# Patient Record
Sex: Male | Born: 1965 | Race: White | Hispanic: No | Marital: Single | Smoking: Former smoker
Health system: Southern US, Community
[De-identification: ages and names within clinical notes are randomized; demographics above are authoritative.]

## PROBLEM LIST (undated history)

## (undated) DIAGNOSIS — F419 Anxiety disorder, unspecified: Secondary | ICD-10-CM

## (undated) DIAGNOSIS — B1920 Unspecified viral hepatitis C without hepatic coma: Secondary | ICD-10-CM

## (undated) DIAGNOSIS — F1920 Other psychoactive substance dependence, uncomplicated: Secondary | ICD-10-CM

## (undated) DIAGNOSIS — F329 Major depressive disorder, single episode, unspecified: Secondary | ICD-10-CM

## (undated) DIAGNOSIS — F32A Depression, unspecified: Secondary | ICD-10-CM

## (undated) HISTORY — PX: KNEE SURGERY: SHX244

## (undated) HISTORY — DX: Depression, unspecified: F32.A

## (undated) HISTORY — DX: Unspecified viral hepatitis C without hepatic coma: B19.20

## (undated) HISTORY — DX: Anxiety disorder, unspecified: F41.9

## (undated) HISTORY — DX: Major depressive disorder, single episode, unspecified: F32.9

## (undated) HISTORY — DX: Other psychoactive substance dependence, uncomplicated: F19.20

---

## 2005-01-30 ENCOUNTER — Emergency Department (HOSPITAL_COMMUNITY): Admission: EM | Admit: 2005-01-30 | Discharge: 2005-01-30 | Payer: Self-pay | Admitting: Emergency Medicine

## 2009-05-18 ENCOUNTER — Ambulatory Visit: Payer: Self-pay | Admitting: Family Medicine

## 2009-05-18 DIAGNOSIS — L723 Sebaceous cyst: Secondary | ICD-10-CM | POA: Insufficient documentation

## 2009-05-19 ENCOUNTER — Encounter: Payer: Self-pay | Admitting: Family Medicine

## 2009-05-29 ENCOUNTER — Encounter: Payer: Self-pay | Admitting: Family Medicine

## 2009-06-15 ENCOUNTER — Telehealth (INDEPENDENT_AMBULATORY_CARE_PROVIDER_SITE_OTHER): Payer: Self-pay | Admitting: *Deleted

## 2009-09-12 ENCOUNTER — Telehealth: Payer: Self-pay | Admitting: Family Medicine

## 2009-10-13 ENCOUNTER — Telehealth: Payer: Self-pay | Admitting: Family Medicine

## 2010-07-11 ENCOUNTER — Emergency Department (HOSPITAL_BASED_OUTPATIENT_CLINIC_OR_DEPARTMENT_OTHER): Admission: EM | Admit: 2010-07-11 | Discharge: 2010-07-12 | Payer: Self-pay | Admitting: Emergency Medicine

## 2010-07-12 ENCOUNTER — Inpatient Hospital Stay (HOSPITAL_COMMUNITY): Admission: EM | Admit: 2010-07-12 | Discharge: 2010-07-13 | Payer: Self-pay | Admitting: Psychiatry

## 2010-07-12 ENCOUNTER — Ambulatory Visit: Payer: Self-pay | Admitting: Psychiatry

## 2010-11-06 NOTE — Progress Notes (Signed)
  Phone Note Call from Patient   Caller: Spouse Call For: Loreen Freud DO Reason for Call: Refill Medication Summary of Call: needs cialis refills Initial call taken by: Loreen Freud DO,  October 13, 2009 12:35 PM  Follow-up for Phone Call        wife informed he has 5 refills Follow-up by: Loreen Freud DO,  October 13, 2009 12:37 PM

## 2010-12-20 LAB — BASIC METABOLIC PANEL
BUN: 14 mg/dL (ref 6–23)
CO2: 26 mEq/L (ref 19–32)
Calcium: 8.6 mg/dL (ref 8.4–10.5)
Creatinine, Ser: 0.8 mg/dL (ref 0.4–1.5)
GFR calc non Af Amer: >60 mL/min (ref >60)
Glucose, Bld: 103 mg/dL — ABNORMAL HIGH (ref 70–99)

## 2010-12-20 LAB — POCT TOXICOLOGY PANEL: Benzodiazepines: POSITIVE

## 2010-12-20 LAB — CBC
MCH: 32.1 pg (ref 26.0–34.0)
MCV: 97.2 fL (ref 78.0–100.0)
Platelets: 156 10*3/uL (ref 150–400)
RBC: 4.74 MIL/uL (ref 4.22–5.81)
WBC: 8.8 10*3/uL (ref 4.0–10.5)

## 2010-12-20 LAB — URINALYSIS, ROUTINE W REFLEX MICROSCOPIC
Hgb urine dipstick: NEGATIVE
Ketones, ur: NEGATIVE mg/dL
Nitrite: NEGATIVE
Specific Gravity, Urine: 1.028 (ref 1.005–1.030)
Urobilinogen, UA: 0.2 mg/dL (ref 0.0–1.0)
pH: 5.5 (ref 5.0–8.0)

## 2010-12-20 LAB — DIFFERENTIAL
Eosinophils Absolute: 0 10*3/uL (ref 0.0–0.7)
Monocytes Relative: 4 % (ref 3–12)
Neutro Abs: 6.3 10*3/uL (ref 1.7–7.7)
Neutrophils Relative %: 72 % (ref 43–77)

## 2010-12-20 LAB — VALPROIC ACID LEVEL: Valproic Acid Lvl: <10 ug/mL — ABNORMAL LOW (ref 50.0–100.0)

## 2010-12-20 LAB — ACETAMINOPHEN LEVEL: Acetaminophen (Tylenol), Serum: <10 ug/mL — ABNORMAL LOW (ref 10–30)

## 2011-05-31 ENCOUNTER — Other Ambulatory Visit: Payer: Self-pay | Admitting: Family Medicine

## 2011-05-31 NOTE — Telephone Encounter (Signed)
He is not a pt here anymore

## 2011-05-31 NOTE — Telephone Encounter (Signed)
Last seen 05/18/2009 and filled 09/12/2009 please advise     KP

## 2016-05-02 ENCOUNTER — Ambulatory Visit (INDEPENDENT_AMBULATORY_CARE_PROVIDER_SITE_OTHER): Payer: BLUE CROSS/BLUE SHIELD | Admitting: Psychology

## 2016-05-02 DIAGNOSIS — F1910 Other psychoactive substance abuse, uncomplicated: Secondary | ICD-10-CM | POA: Diagnosis not present

## 2016-05-07 ENCOUNTER — Ambulatory Visit (INDEPENDENT_AMBULATORY_CARE_PROVIDER_SITE_OTHER): Payer: BLUE CROSS/BLUE SHIELD | Admitting: Psychology

## 2016-05-07 DIAGNOSIS — F1910 Other psychoactive substance abuse, uncomplicated: Secondary | ICD-10-CM

## 2016-05-15 ENCOUNTER — Ambulatory Visit (INDEPENDENT_AMBULATORY_CARE_PROVIDER_SITE_OTHER): Payer: BLUE CROSS/BLUE SHIELD | Admitting: Psychology

## 2016-05-15 DIAGNOSIS — F1910 Other psychoactive substance abuse, uncomplicated: Secondary | ICD-10-CM

## 2016-05-27 ENCOUNTER — Ambulatory Visit (INDEPENDENT_AMBULATORY_CARE_PROVIDER_SITE_OTHER): Payer: BLUE CROSS/BLUE SHIELD | Admitting: Psychology

## 2016-05-27 DIAGNOSIS — F1910 Other psychoactive substance abuse, uncomplicated: Secondary | ICD-10-CM | POA: Diagnosis not present

## 2016-06-05 ENCOUNTER — Ambulatory Visit (INDEPENDENT_AMBULATORY_CARE_PROVIDER_SITE_OTHER): Payer: BLUE CROSS/BLUE SHIELD | Admitting: Psychology

## 2016-06-05 DIAGNOSIS — F1910 Other psychoactive substance abuse, uncomplicated: Secondary | ICD-10-CM | POA: Diagnosis not present

## 2016-06-24 ENCOUNTER — Ambulatory Visit (INDEPENDENT_AMBULATORY_CARE_PROVIDER_SITE_OTHER): Payer: BLUE CROSS/BLUE SHIELD | Admitting: Psychology

## 2016-06-24 DIAGNOSIS — F1910 Other psychoactive substance abuse, uncomplicated: Secondary | ICD-10-CM

## 2016-07-10 ENCOUNTER — Ambulatory Visit (INDEPENDENT_AMBULATORY_CARE_PROVIDER_SITE_OTHER): Payer: BLUE CROSS/BLUE SHIELD | Admitting: Psychology

## 2016-07-10 DIAGNOSIS — F1910 Other psychoactive substance abuse, uncomplicated: Secondary | ICD-10-CM | POA: Diagnosis not present

## 2016-07-22 ENCOUNTER — Ambulatory Visit: Payer: BLUE CROSS/BLUE SHIELD | Admitting: Psychology

## 2016-08-06 ENCOUNTER — Ambulatory Visit (INDEPENDENT_AMBULATORY_CARE_PROVIDER_SITE_OTHER): Payer: BLUE CROSS/BLUE SHIELD | Admitting: Psychology

## 2016-08-06 DIAGNOSIS — F1910 Other psychoactive substance abuse, uncomplicated: Secondary | ICD-10-CM

## 2016-08-23 ENCOUNTER — Ambulatory Visit: Payer: BLUE CROSS/BLUE SHIELD | Admitting: Psychology

## 2016-09-09 ENCOUNTER — Ambulatory Visit (INDEPENDENT_AMBULATORY_CARE_PROVIDER_SITE_OTHER): Payer: BLUE CROSS/BLUE SHIELD | Admitting: Psychology

## 2016-09-09 DIAGNOSIS — F1910 Other psychoactive substance abuse, uncomplicated: Secondary | ICD-10-CM
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
## 2016-09-18 ENCOUNTER — Ambulatory Visit (INDEPENDENT_AMBULATORY_CARE_PROVIDER_SITE_OTHER): Payer: BLUE CROSS/BLUE SHIELD | Admitting: Psychiatry

## 2016-09-18 ENCOUNTER — Encounter (HOSPITAL_COMMUNITY): Payer: Self-pay | Admitting: Psychiatry

## 2016-09-18 VITALS — BP 142/78 | HR 68 | Ht 74.0 in | Wt 212.0 lb

## 2016-09-18 DIAGNOSIS — Z79899 Other long term (current) drug therapy: Secondary | ICD-10-CM | POA: Diagnosis not present

## 2016-09-18 DIAGNOSIS — F411 Generalized anxiety disorder: Secondary | ICD-10-CM

## 2016-09-18 DIAGNOSIS — Z9889 Other specified postprocedural states: Secondary | ICD-10-CM

## 2016-09-18 DIAGNOSIS — Z87891 Personal history of nicotine dependence: Secondary | ICD-10-CM

## 2016-09-18 MED ORDER — GABAPENTIN 300 MG PO CAPS: ORAL_CAPSULE | ORAL | 1 refills | Status: DC

## 2016-09-18 NOTE — Patient Instructions (Addendum)
1. Continue Wellbutrin 150 mg daily 2. Start gabapentin 300 mg at night for one week, then 300 mg twice a day 3. Discontinue lamotrigine 4. Return to clinic in January

## 2016-10-03 ENCOUNTER — Ambulatory Visit (INDEPENDENT_AMBULATORY_CARE_PROVIDER_SITE_OTHER): Payer: BLUE CROSS/BLUE SHIELD | Admitting: Psychology

## 2016-10-03 DIAGNOSIS — F1910 Other psychoactive substance abuse, uncomplicated: Secondary | ICD-10-CM | POA: Diagnosis not present

## 2016-10-09 ENCOUNTER — Telehealth (HOSPITAL_COMMUNITY): Payer: Self-pay

## 2016-10-09 NOTE — Telephone Encounter (Signed)
Patient called to let you know that he had some really bad side effects on the Gabapentin, he stopped taking it and feels better now. Patient states that his mood is stable and his anxiety seems better. He just wanted you to know

## 2016-10-21 ENCOUNTER — Ambulatory Visit (INDEPENDENT_AMBULATORY_CARE_PROVIDER_SITE_OTHER): Payer: BLUE CROSS/BLUE SHIELD | Admitting: Psychology

## 2016-10-21 DIAGNOSIS — F1910 Other psychoactive substance abuse, uncomplicated: Secondary | ICD-10-CM
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
## 2016-11-08 ENCOUNTER — Ambulatory Visit: Payer: Self-pay | Admitting: Psychology

## 2016-11-11 ENCOUNTER — Ambulatory Visit (HOSPITAL_COMMUNITY): Payer: Self-pay | Admitting: Psychiatry

## 2016-12-09 ENCOUNTER — Other Ambulatory Visit (HOSPITAL_COMMUNITY): Payer: Self-pay

## 2016-12-09 MED ORDER — BUPROPION HCL ER (XL) 150 MG PO TB24
150.0000 mg | ORAL_TABLET | Freq: Every day | ORAL | 0 refills | Status: DC
Start: 2016-12-09 — End: 2016-12-20

## 2016-12-16 ENCOUNTER — Ambulatory Visit (HOSPITAL_COMMUNITY): Payer: Self-pay | Admitting: Psychiatry

## 2016-12-20 ENCOUNTER — Encounter (HOSPITAL_COMMUNITY): Payer: Self-pay | Admitting: Psychiatry

## 2016-12-20 ENCOUNTER — Ambulatory Visit (INDEPENDENT_AMBULATORY_CARE_PROVIDER_SITE_OTHER): Payer: BLUE CROSS/BLUE SHIELD | Admitting: Psychiatry

## 2016-12-20 ENCOUNTER — Encounter: Payer: Self-pay | Admitting: Psychiatry

## 2016-12-20 VITALS — BP 122/74 | HR 60 | Ht 74.0 in | Wt 215.0 lb

## 2016-12-20 DIAGNOSIS — Z87891 Personal history of nicotine dependence: Secondary | ICD-10-CM | POA: Diagnosis not present

## 2016-12-20 DIAGNOSIS — F3341 Major depressive disorder, recurrent, in partial remission: Secondary | ICD-10-CM | POA: Diagnosis not present

## 2016-12-20 DIAGNOSIS — Z79899 Other long term (current) drug therapy: Secondary | ICD-10-CM | POA: Diagnosis not present

## 2016-12-20 MED ORDER — TRAZODONE HCL 50 MG PO TABS: 50.0000 mg | ORAL_TABLET | Freq: Every day | ORAL | 1 refills | Status: DC

## 2016-12-20 MED ORDER — BUPROPION HCL ER (XL) 300 MG PO TB24: 300.0000 mg | ORAL_TABLET | Freq: Every day | ORAL | 0 refills | Status: DC

## 2016-12-20 NOTE — Progress Notes (Signed)
BH MD/PA/NP OP Progress Note  12/20/2016 8:42 AM Timothy Ramsey  MRN:  [id number]  Chief Complaint: anxiety Subjective:  Patient presents today for psychiatric transfer of care. He was previously seen in this clinic by another provider. I reviewed those notes prior to his presentation.  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 He reports, with shame the damage that his substance abuse contributed to his family. He is proud to share that he has now been abstinent since May 2016, and continues to engage in [employer] meetings 2-3 times weekly. 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 He [place], and has been working on reestablishing a relationship with his children, ages 3213, 7515, 4318. He reports that he still struggles with some down and depressed mood at times, but he feels that the Wellbutrin has helped to some degree. He denies any suicidal thoughts. He reports that he has a good social support network, and is close with his parents.  He reports he's been thinking about getting a dog.  Regarding his mood, he would be on board with increasing Wellbutrin to 300 mg. We reviewed the risks and benefits. He continues to have some sleep difficulties, namely frequent awakenings in the night. We discussed the use of trazodone, risks and benefits of this. He agrees to trial of this. He has not been taking gabapentin, as it made him feel sedated and weird. We agreed to make these changes and follow-up in 2 months, or sooner if needed.  Visit Diagnosis:    ICD-9-CM ICD-10-CM   1. Recurrent major depressive disorder, in partial remission (HCC) 296.35 F33.41 buPROPion (WELLBUTRIN XL) 300 MG 24 hr tablet     traZODone (DESYREL) 50 MG tablet     Past Psychiatric History: See intake H&P for full details. Reviewed, with no updates at this time.  Past Medical History:  Past Medical History:  Diagnosis Date  . Addiction to drug (HCC)   . Anxiety   . Depression   . Hepatitis C     Past Surgical History:  Procedure Laterality Date  . KNEE SURGERY      Family Psychiatric History: See intake H&P for full details. Reviewed, with no updates at this time.   Family History: No family history on file.  Social History:  Social History   Social History  . Marital status: Married    Spouse name: N/A  . Number of children: N/A  . Years of education: N/A   Social History Main Topics  . Smoking status: Former Smoker    Types: Cigarettes    Quit date: 02/07/2015  . Smokeless tobacco: Current User    Types: Snuff  . Alcohol use No  . Drug use: No  . Sexual activity: Yes    Birth control/ protection: None   Other Topics Concern  . Not on file   Social History Narrative  . No narrative on file    Allergies: Not on File  Metabolic Disorder Labs: No results found for: HGBA1C, MPG No results found for: PROLACTIN No results found for: CHOL, TRIG, HDL, CHOLHDL, VLDL, LDLCALC   Current Medications: Current Outpatient Prescriptions  Medication Sig Dispense Refill  . buPROPion (WELLBUTRIN XL) 300 MG 24 hr tablet Take 1  tablet (300 mg total) by mouth daily. 90 tablet 0  . hydrochlorothiazide (HYDRODIURIL) 25 MG tablet Take by mouth.    . Multiple Vitamin (THERA) TABS Take by mouth.    . traZODone (DESYREL) 50 MG tablet Take 1 tablet (50 mg total) by mouth at bedtime. Take 50 to 100 mg at bedtime 90 tablet 1   No current facility-administered medications for this visit.     Neurologic: Headache: Negative Seizure: Negative Paresthesias: Negative  Musculoskeletal: Strength & Muscle Tone: within normal limits Gait & Station: normal Patient leans: N/A  Psychiatric Specialty Exam: Review of Systems  Constitutional:  Negative.   HENT: Negative.   Respiratory: Negative.   Cardiovascular: Negative.   Gastrointestinal: Negative.   Genitourinary: Negative.   Musculoskeletal: Negative.   Neurological: Negative.   Psychiatric/Behavioral: Positive for depression. The patient has insomnia.     Blood pressure 122/74, pulse 60, height 6\' 2"  (1.88 m), weight 97.5 kg (215 lb).Body mass index is 27.6 kg/m.  General Appearance: Casual  Eye Contact:  Good  Speech:  Clear and Coherent  Volume:  Normal  Mood:  Euthymic  Affect:  Appropriate  Thought Process:  Goal Directed  Orientation:  Full (Time, Place, and Person)  Thought Content: Logical   Suicidal Thoughts:  No  Homicidal Thoughts:  No  Memory:  Immediate;   Good  Judgement:  Good  Insight:  Good  Psychomotor Activity:  Normal  Concentration:  Concentration: Good and Attention Span: Good  Recall:  Good  Fund of Knowledge: Good  Language: Good  Akathisia:  Negative  Handed:  Right  AIMS (if indicated):  n/a  Assets:  Communication Skills Desire for Improvement Financial Resources/Insurance Housing Intimacy Leisure Time Physical Health Resilience Social Support Talents/Skills Transportation Vocational/Educational  ADL's:  Intact  Cognition: WNL  Sleep:  4-5 hours nightly    Treatment Plan Summary:Timothy Ramsey is a 51 year old male with a history of anxiety depression and hepatitis C (s/p treatment and remission) due to substance use disorder, abstinent since May 2016, who presents today for medication management follow-up.  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 He works out regularly, and has a strong sense of faith. He is actively engaged in [employer] His mood appears to be generally improving with Wellbutrin, but would benefit from and up titration. He has continued struggle with insomnia, and would benefit from a mild to  moderate dose of trazodone.  No acute safety issues and will follow up in 2 months.  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 This includes every day phone calls, and random drug screens.  1. Recurrent major depressive disorder, in partial remission (HCC)    - Wellbutrin increased to 300 mg XL daily - Gabapentin discontinued by patient given in tolerance and sedation - Trazodone 50-100 mg nightly for sleep - Return to clinic in 2 months - Patient to continue engaging in AA meetings   Burnard Leigh, MD 12/20/2016, 8:33 AM

## 2017-02-19 ENCOUNTER — Ambulatory Visit (INDEPENDENT_AMBULATORY_CARE_PROVIDER_SITE_OTHER): Payer: BLUE CROSS/BLUE SHIELD | Admitting: Psychiatry

## 2017-02-19 ENCOUNTER — Encounter (HOSPITAL_COMMUNITY): Payer: Self-pay | Admitting: Psychiatry

## 2017-02-19 DIAGNOSIS — Z79899 Other long term (current) drug therapy: Secondary | ICD-10-CM | POA: Diagnosis not present

## 2017-02-19 DIAGNOSIS — F3341 Major depressive disorder, recurrent, in partial remission: Secondary | ICD-10-CM | POA: Diagnosis not present

## 2017-02-19 DIAGNOSIS — Z87891 Personal history of nicotine dependence: Secondary | ICD-10-CM

## 2017-02-19 MED ORDER — TRAZODONE HCL 50 MG PO TABS: 50.0000 mg | ORAL_TABLET | Freq: Every day | ORAL | 1 refills | Status: DC

## 2017-02-19 MED ORDER — BUPROPION HCL ER (XL) 300 MG PO TB24: 300.0000 mg | ORAL_TABLET | Freq: Every day | ORAL | 1 refills | Status: DC

## 2017-02-19 NOTE — Progress Notes (Signed)
BH MD/PA/NP OP Progress Note  02/19/2017 2:25 PM Timothy Ramsey  MRN:  [id number]  Chief Complaint: doing well  Subjective:  Timothy Ramsey presents today for psychiatric follow-up for alcohol use disorder and depression graduated him on celebrating 2 years sobriety last week. 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 He reports that he continues to have some relationship difficulties with his girlfriend, and I suggested they consider couples therapy. He was very receptive to this, and will talk with his girlfriend about starting that closer to [place] and [place].  He feels like the Wellbutrin 300 mg has been well-tolerated, and his mood is in good spirits. He is sleeping well, without the use of trazodone. He reports that he has used trazodone once or twice in the past month, and it's available if he has difficulty sleeping. He denies any significant mood symptoms. Reports that he has been handling regular stressors related to work and relationships very well. We spent time discussing his ongoing sobriety, participation in AA, and long-term management with Wellbutrin. He agrees to follow-up in 4-6 months, or sooner if needed.  Visit Diagnosis:    ICD-9-CM ICD-10-CM   1. Recurrent major depressive disorder, in partial remission (HCC) 296.35 F33.41 traZODone (DESYREL) 50 MG tablet     buPROPion (WELLBUTRIN XL) 300 MG 24 hr tablet    Past Psychiatric History: See intake H&P for full details. Reviewed, with no updates at this time.   Past Medical History:  Past Medical History:  Diagnosis Date  . Addiction to drug (HCC)   . Anxiety   . Depression   . Hepatitis C     Past Surgical History:  Procedure Laterality Date  . KNEE SURGERY      Family Psychiatric History: See intake H&P for full details. Reviewed, with no updates at this time.   Family History: History reviewed. No pertinent family history.  Social  History:  Social History   Social History  . Marital status: Married    Spouse name: N/A  . Number of children: N/A  . Years of education: N/A   Social History Main Topics  . Smoking status: Former Smoker    Types: Cigarettes    Quit date: 02/07/2015  . Smokeless tobacco: Current User    Types: Snuff  . Alcohol use No  . Drug use: No  . Sexual activity: Yes    Birth control/ protection: None   Other Topics Concern  . None   Social History Narrative  . None    Allergies: Not on File  Metabolic Disorder Labs: No results found for: HGBA1C, MPG No results found for: PROLACTIN No results found for: CHOL, TRIG, HDL, CHOLHDL, VLDL, LDLCALC   Current Medications: Current Outpatient Prescriptions  Medication Sig Dispense Refill  . buPROPion (WELLBUTRIN XL) 300 MG 24 hr tablet Take 1 tablet (300 mg total) by mouth daily. 90 tablet 1  . hydrochlorothiazide (HYDRODIURIL) 25 MG tablet Take by mouth.    . Multiple Vitamin (THERA) TABS Take by mouth.    . traZODone (DESYREL) 50 MG tablet Take 1 tablet (50 mg total) by mouth at bedtime. Take 50 to 100 mg at bedtime 90 tablet 1   No current facility-administered medications for this visit.     Neurologic: Headache: Negative Seizure: Negative Paresthesias: Negative  Musculoskeletal: Strength & Muscle Tone: within normal limits Gait & Station: normal Patient leans: N/A  Psychiatric Specialty Exam: ROS  Blood pressure 138/70, pulse 88, height 6\' 2"  (1.88 m), weight 213 lb (96.6 kg).Body mass index is 27.35 kg/m.  General Appearance: Casual and Well Groomed  Eye Contact:  Good  Speech:  Clear and Coherent  Volume:  Normal  Mood:  Euthymic and good cheer  Affect:  Congruent  Thought Process:  Goal Directed  Orientation:  Full (Time, Place, and Person)  Thought Content: Logical   Suicidal Thoughts:  No  Homicidal Thoughts:  No  Memory:  Immediate;   Good  Judgement:  Good  Insight:  Good  Psychomotor Activity:  Normal   Concentration:  Concentration: Good  Recall:  Good  Fund of Knowledge: Good  Language: Good  Akathisia:  Negative  Handed:  Right  AIMS (if indicated):  0  Assets:  Communication Skills Desire for Improvement Financial Resources/Insurance Housing Intimacy Leisure Time Physical Health Resilience Social Support Talents/Skills Transportation Vocational/Educational  ADL's:  Intact  Cognition: WNL  Sleep:  7-9 hours   Treatment Plan Summary: Timothy Ramsey is a 51 year old male with alcohol use disorder in sustained remission for 2 years, major depressive disorder, who presents today for psychiatric follow-up. He has had complete remission of his symptoms with Wellbutrin 300 mg daily. He continues to engage in [place] and substance use groups and [place]. He is employed, and maintains regular contact with his children who live nearby. No acute safety issues, and is appropriate to follow-up in 4-6 months or sooner if needed  1. Recurrent major depressive disorder, in partial remission (HCC)    Continue Wellbutrin 300 mg daily Trazodone 50-100 mg nightly for sleep as needed Follow-up in 4-6 months Should continue to follow with [place] in psychiatry, in case he has any major stressors, as that would be a risk for relapse  Burnard Leigh, MD 02/19/2017, 2:25 PM

## 2017-05-19 ENCOUNTER — Ambulatory Visit (INDEPENDENT_AMBULATORY_CARE_PROVIDER_SITE_OTHER): Payer: BLUE CROSS/BLUE SHIELD | Admitting: Psychology

## 2017-05-19 DIAGNOSIS — F1910 Other psychoactive substance abuse, uncomplicated: Secondary | ICD-10-CM | POA: Diagnosis not present

## 2017-06-16 ENCOUNTER — Ambulatory Visit (INDEPENDENT_AMBULATORY_CARE_PROVIDER_SITE_OTHER): Payer: BLUE CROSS/BLUE SHIELD | Admitting: Psychiatry

## 2017-06-16 ENCOUNTER — Encounter (HOSPITAL_COMMUNITY): Payer: Self-pay | Admitting: Psychiatry

## 2017-06-16 DIAGNOSIS — Z87891 Personal history of nicotine dependence: Secondary | ICD-10-CM | POA: Diagnosis not present

## 2017-06-16 DIAGNOSIS — F419 Anxiety disorder, unspecified: Secondary | ICD-10-CM | POA: Diagnosis not present

## 2017-06-16 DIAGNOSIS — F3341 Major depressive disorder, recurrent, in partial remission: Secondary | ICD-10-CM | POA: Diagnosis not present

## 2017-06-16 DIAGNOSIS — F1921 Other psychoactive substance dependence, in remission: Secondary | ICD-10-CM | POA: Diagnosis not present

## 2017-06-16 MED ORDER — ESCITALOPRAM OXALATE 10 MG PO TABS: 10.0000 mg | ORAL_TABLET | Freq: Every day | ORAL | 1 refills | Status: DC

## 2017-06-16 MED ORDER — BUPROPION HCL ER (XL) 300 MG PO TB24: 300.0000 mg | ORAL_TABLET | Freq: Every day | ORAL | 1 refills | Status: DC

## 2017-06-16 NOTE — Progress Notes (Signed)
BH MD/PA/NP OP Progress Note  06/16/2017 2:30 PM JERAME HEDDING  MRN:  [id number]  Chief Complaint:  Chief Complaint    Follow-up     HPI: TAFARI HUMISTON reports that things are been pretty well in terms of his mood, particularly his depression has been kept at bay. He's noticed a little bit more anxiety and had some episodes of panic-like feelings and sensations. These had a lot going on in terms of life stressors, anxious about his son starting college, and his other son looking at new colleges for next year. 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 He denies any substance abuse, and denies any intention or thoughts to use. He reports that he and his girlfriend continue to work on strengthening the relationship. We discussed augmenting Wellbutrin 300 mg with a dose of Lexapro 10 mg, as he had done fairly well in the past with the 2 in combination. He agrees to follow-up in 3 months or sooner if needed.  Visit Diagnosis:    ICD-10-CM   1. Recurrent major depressive disorder, in partial remission (HCC) F33.41 escitalopram (LEXAPRO) 10 MG tablet    buPROPion (WELLBUTRIN XL) 300 MG 24 hr tablet    Past Psychiatric History: See intake H&P for full details. Reviewed, with no updates at this time.   Past Medical History:  Past Medical History:  Diagnosis Date  . Addiction to drug (HCC)   . Anxiety   . Depression   . Hepatitis C     Past Surgical History:  Procedure Laterality Date  . KNEE SURGERY      Family Psychiatric History: See intake H&P for full details. Reviewed, with no updates at this time.   Family History: History reviewed. No pertinent family history.  Social History:  Social History   Social History  . Marital status: Married    Spouse name: N/A  . Number of children: N/A  . Years of education: N/A   Social History Main Topics  . Smoking status: Former Smoker    Types:  Cigarettes    Quit date: 02/07/2015  . Smokeless tobacco: Former Neurosurgeon    Types: Snuff    Quit date: 12/14/2016  . Alcohol use No  . Drug use: No  . Sexual activity: Yes    Birth control/ protection: None   Other Topics Concern  . None   Social History Narrative  . None    Allergies: Not on File  Metabolic Disorder Labs: No results found for: HGBA1C, MPG No results found for: PROLACTIN No results found for: CHOL, TRIG, HDL, CHOLHDL, VLDL, LDLCALC No results found for: TSH  Therapeutic Level Labs: No results found for: LITHIUM Lab Results  Component Value Date   VALPROATE <10.0 (L) 07/11/2010   No components found for:  CBMZ  Current Medications: Current Outpatient Prescriptions  Medication Sig Dispense Refill  . buPROPion (WELLBUTRIN XL) 300 MG 24 hr tablet Take 1 tablet (300 mg total) by mouth daily. 90 tablet 1  . hydrochlorothiazide (HYDRODIURIL) 25 MG tablet Take by mouth.    . Multiple Vitamin (THERA) TABS Take by mouth.    . escitalopram (LEXAPRO) 10 MG tablet Take 1 tablet (10 mg total) by mouth daily. 90 tablet 1   No current facility-administered medications for this visit.      Musculoskeletal: Strength & Muscle Tone: within normal limits Gait & Station: normal Patient leans: N/A  Psychiatric Specialty Exam: ROS  Blood pressure (!) 155/97, pulse 68, height 6\' 2"  (1.88 m), weight 204 lb 9.6 oz (92.8 kg).Body mass index is 26.27 kg/m.  General Appearance: Casual and Well Groomed  Eye Contact:  Good  Speech:  Clear and Coherent  Volume:  Normal  Mood:  Anxious and Euthymic  Affect:  Appropriate and Congruent  Thought Process:  Goal Directed  Orientation:  Full (Time, Place, and Person)  Thought Content: Logical   Suicidal Thoughts:  No  Homicidal Thoughts:  No  Memory:  Immediate;   Good  Judgement:  Good  Insight:  Good  Psychomotor Activity:  Normal  Concentration:  Concentration: Fair  Recall:  Fair  Fund of Knowledge: Good  Language: Good   Akathisia:  Negative  Handed:  Right  AIMS (if indicated): not done  Assets:  Communication Skills Desire for Improvement Financial Resources/Insurance Housing Intimacy Leisure Time Physical Health Resilience Social Support Talents/Skills Transportation Vocational/Educational  ADL's:  Intact  Cognition: WNL  Sleep:  Good   Screenings:   Assessment and Plan: Maren BeachDevin F Lobban is a 51 year old male with substance abuse in sustained remission, and major depressive disorder. He has had an increase in anxiety lately with some life and external stressors, but overall remains abstinent and safe with himself. He would benefit from a low-dose of SSRI in addition to continued Wellbutrin. He does not often use trazodone, but has it available if needed. We will follow-up in 3 months or sooner if needed.  1. Recurrent major depressive disorder, in partial remission (HCC)    Continue Wellbutrin 300 mg XL daily Initiate Lexapro 10 mg daily Trazodone discontinued off of his list, he uses this sparingly, and prefers to avoid it Return to clinic in 3 months  Burnard LeighAlexander Arya Amiel Mccaffrey, MD 06/16/2017, 2:30 PM

## 2017-06-19 ENCOUNTER — Telehealth (HOSPITAL_COMMUNITY): Payer: Self-pay

## 2017-06-19 NOTE — Telephone Encounter (Signed)
Patient is calling because he was started on Lexapro a few days ago and since then his already high blood pressure is even higher. He saw his PCP today and she believes there could be a correlation, please review and advise, thank you

## 2017-06-20 NOTE — Telephone Encounter (Signed)
Called patient and discussed decreasing Lexapro for a week, patient is agreeable to this plan

## 2017-06-20 NOTE — Telephone Encounter (Signed)
That would be quite unusual, but he can go ahead and decrease the lexapro to 5 mg for the next week to let his body get used to the effects

## 2017-06-30 ENCOUNTER — Ambulatory Visit: Payer: Self-pay | Admitting: Psychology

## 2017-09-15 ENCOUNTER — Ambulatory Visit (HOSPITAL_COMMUNITY): Payer: Self-pay | Admitting: Psychiatry

## 2018-03-10 ENCOUNTER — Other Ambulatory Visit (HOSPITAL_COMMUNITY): Payer: Self-pay

## 2018-03-10 ENCOUNTER — Telehealth (HOSPITAL_COMMUNITY): Payer: Self-pay

## 2018-03-10 DIAGNOSIS — F3341 Major depressive disorder, recurrent, in partial remission: Secondary | ICD-10-CM

## 2018-03-10 MED ORDER — BUPROPION HCL ER (XL) 300 MG PO TB24: 300.0000 mg | ORAL_TABLET | Freq: Every day | ORAL | 0 refills | Status: DC

## 2018-03-10 NOTE — Telephone Encounter (Signed)
Sent in a 30 day refill to [facility] on [address]

## 2018-03-10 NOTE — Telephone Encounter (Signed)
Patient called requesting a refill on Bupropion 300mg . Patient had to reschedule appointment for 03-12-18 to 04-01-18. [facility] on [address]. Please advise

## 2018-03-10 NOTE — Telephone Encounter (Signed)
Okay to send 30 days, thank you!

## 2018-03-12 ENCOUNTER — Ambulatory Visit (HOSPITAL_COMMUNITY): Payer: Self-pay | Admitting: Psychiatry

## 2018-04-01 ENCOUNTER — Encounter (HOSPITAL_COMMUNITY): Payer: Self-pay | Admitting: Psychiatry

## 2018-04-01 ENCOUNTER — Encounter

## 2018-04-01 ENCOUNTER — Ambulatory Visit (HOSPITAL_COMMUNITY): Payer: BLUE CROSS/BLUE SHIELD | Admitting: Psychiatry

## 2018-04-01 VITALS — BP 148/100 | HR 74 | Ht 74.0 in | Wt 204.0 lb

## 2018-04-01 DIAGNOSIS — F332 Major depressive disorder, recurrent severe without psychotic features: Secondary | ICD-10-CM

## 2018-04-01 MED ORDER — BUPROPION HCL ER (XL) 150 MG PO TB24: ORAL_TABLET | ORAL | 0 refills | Status: AC

## 2018-04-01 MED ORDER — BUPROPION HCL ER (XL) 300 MG PO TB24: 300.0000 mg | ORAL_TABLET | Freq: Every day | ORAL | 0 refills | Status: AC

## 2018-04-01 NOTE — Progress Notes (Signed)
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  He reports that his parents are very supportive, and he was not able to afford his medicines for a few months, so they paid for him to continue on Wellbutrin.  We discussed an increase in Wellbutrin to 450 mg given that he had some understandable depressive symptoms given the stressors in his life.  He reports that when he was off of his Wellbutrin he said some very mean things to his daughter's and they have not spoken to him since then, he has made multiple attempts to apologize and will keep trying to do so.   He has not used any substances of abuse and reports that his sobriety remains very important to him.  He wonders about TMS to address depressive symptoms given that he has been tried on Wellbutrin, Lexapro, Prozac, Zoloft, trazodone, Lamictal, and has still had significant breakthrough depressive symptoms.  I educated him on the risks and benefits of TMS, including the risk of seizure in 1 out of 1000 patients.  I educated him on the modality of action.  Confirmed that he does not have any medical contraindications to TMS and he was receptive to receiving prior authorization processing and proceeding with treatment when approved.  Reviewed the risks of increasing Wellbutrin, and encouraged him to reach out if he has any worsening of mood or difficulty with sleep.  Visit Diagnosis:    ICD-10-CM   1. Severe episode of recurrent major depressive disorder, without psychotic features (HCC) F33.2 buPROPion (WELLBUTRIN XL) 300 MG 24 hr tablet    buPROPion (WELLBUTRIN XL) 150 MG 24 hr tablet    Past Psychiatric History: See  intake H&P for full details. Reviewed, with no updates at this time.   Past Medical History:  Past Medical History:  Diagnosis Date  . Addiction to drug (HCC)   . Anxiety   . Depression   . Hepatitis C     Past Surgical History:  Procedure Laterality Date  . KNEE SURGERY      Family Psychiatric History: See intake H&P for full details. Reviewed, with no updates at this time.   Family History: No family history on file.  Social History:  Social History   Socioeconomic History  . Marital status: Married    Spouse name: Not on file  . Number of children: Not on file  . Years of education: Not on file  . Highest education level: Not on file  Occupational History  . Not on file  Social Needs  . Financial resource strain: Not on file  . Food insecurity:    Worry: Not on file    Inability: Not on file  . Transportation needs:    Medical: Not on file    Non-medical: Not on file  Tobacco Use  . Smoking status: Former Smoker    Types: Cigarettes    Last attempt to quit: 02/07/2015    Years since quitting: 3.1  . Smokeless tobacco: Former NeurosurgeonUser    Types: Snuff    Quit date: 12/14/2016  Substance and Sexual Activity  . Alcohol use: No  . Drug use: No  .  Sexual activity: Yes    Birth control/protection: None  Lifestyle  . Physical activity:    Days per week: Not on file    Minutes per session: Not on file  . Stress: Not on file  Relationships  . Social connections:    Talks on phone: Not on file    Gets together: Not on file    Attends religious service: Not on file    Active member of club or organization: Not on file    Attends meetings of clubs or organizations: Not on file    Relationship status: Not on file  Other Topics Concern  . Not on file  Social History Narrative  . Not on file    Allergies: Not on File  Metabolic Disorder Labs: No results found for: HGBA1C, MPG No results found for: PROLACTIN No results found for: CHOL, TRIG, HDL, CHOLHDL, VLDL,  LDLCALC No results found for: TSH  Therapeutic Level Labs: No results found for: LITHIUM Lab Results  Component Value Date   VALPROATE <10.0 (L) 07/11/2010   No components found for:  CBMZ  Current Medications: Current Outpatient Medications  Medication Sig Dispense Refill  . buPROPion (WELLBUTRIN XL) 300 MG 24 hr tablet Take 1 tablet (300 mg total) by mouth daily. 90 tablet 0  . Multiple Vitamin (THERA) TABS Take by mouth.    Marland Kitchen buPROPion (WELLBUTRIN XL) 150 MG 24 hr tablet Take with 300 for a total of 450 90 tablet 0  . hydrochlorothiazide (HYDRODIURIL) 25 MG tablet Take by mouth.     No current facility-administered medications for this visit.      Musculoskeletal: Strength & Muscle Tone: within normal limits Gait & Station: normal Patient leans: N/A  Psychiatric Specialty Exam: ROS  Blood pressure (!) 148/100, pulse 74, height 6\' 2"  (1.88 m), weight 204 lb (92.5 kg), SpO2 98 %.Body mass index is 26.19 kg/m.  General Appearance: Casual and Well Groomed  Eye Contact:  Good  Speech:  Clear and Coherent  Volume:  Normal  Mood:  Dysphoric  Affect:  Congruent and Depressed  Thought Process:  Goal Directed  Orientation:  Full (Time, Place, and Person)  Thought Content: Logical   Suicidal Thoughts:  No  Homicidal Thoughts:  No  Memory:  Immediate;   Good  Judgement:  Good  Insight:  Good  Psychomotor Activity:  Normal  Concentration:  Concentration: Fair  Recall:  Fair  Fund of Knowledge: Good  Language: Good  Akathisia:  Negative  Handed:  Right  AIMS (if indicated): not done  Assets:  Communication Skills Desire for Improvement Financial Resources/Insurance Housing Intimacy Leisure Time Physical Health Resilience Social Support Talents/Skills Transportation Vocational/Educational  ADL's:  Intact  Cognition: WNL  Sleep:  Fair   Screenings:   Assessment and Plan: RICHMOND COLDREN presents with recurrence of depressed mood, in the context of financial  stressors, losing his job, stressors in terms of his relationships with his children.  He does feel ongoing depressive symptoms and we discussed an increase in Wellbutrin given that he tends to have a positive response to Wellbutrin.  Part of his depression symptoms were manifest as a result of his discontinuation of Wellbutrin because he was no longer able to afford the medication when he lost his job several months ago.   He has not used any substances and reports that he has remained committed to his sobriety.  Given his multiple treatment failures with various antidepressants, I suggested we consider TMS and he was receptive  to proceeding with this.  He has no medical contraindications or history of seizures and I reviewed the risks of TMS including pain at the treatment site, headache, and risk of seizure in 1 out of 1000 patients.  1. Severe episode of recurrent major depressive disorder, without psychotic features (HCC)    Increase Wellbutrin 450 mg XL daily Return to clinic in 4-6 weeks Proceed with TMS authorization and treatment  Burnard Leigh, MD 04/01/2018, 11:38 AM

## 2018-04-29 ENCOUNTER — Ambulatory Visit (HOSPITAL_COMMUNITY): Payer: Self-pay | Admitting: Psychiatry

## 2019-01-27 ENCOUNTER — Telehealth (HOSPITAL_COMMUNITY): Payer: Self-pay | Admitting: Psychology

## 2019-01-29 ENCOUNTER — Ambulatory Visit (INDEPENDENT_AMBULATORY_CARE_PROVIDER_SITE_OTHER): Payer: BLUE CROSS/BLUE SHIELD | Admitting: Licensed Clinical Social Worker

## 2019-01-29 ENCOUNTER — Ambulatory Visit (HOSPITAL_COMMUNITY): Payer: BLUE CROSS/BLUE SHIELD | Admitting: Psychology

## 2019-01-29 ENCOUNTER — Other Ambulatory Visit: Payer: Self-pay

## 2019-01-29 DIAGNOSIS — F332 Major depressive disorder, recurrent severe without psychotic features: Secondary | ICD-10-CM

## 2019-01-29 DIAGNOSIS — F1420 Cocaine dependence, uncomplicated: Secondary | ICD-10-CM

## 2019-01-29 DIAGNOSIS — F4310 Post-traumatic stress disorder, unspecified: Secondary | ICD-10-CM
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
## 2019-02-05 DIAGNOSIS — Z8616 Personal history of COVID-19: Secondary | ICD-10-CM

## 2019-02-05 HISTORY — DX: Personal history of COVID-19: Z86.16

## 2019-09-07 ENCOUNTER — Ambulatory Visit (HOSPITAL_COMMUNITY): Payer: Self-pay | Admitting: Licensed Clinical Social Worker

## 2019-10-11 ENCOUNTER — Ambulatory Visit (HOSPITAL_COMMUNITY): Payer: 59 | Admitting: Licensed Clinical Social Worker

## 2019-12-25 ENCOUNTER — Other Ambulatory Visit: Payer: Self-pay

## 2019-12-25 ENCOUNTER — Emergency Department (HOSPITAL_COMMUNITY): Payer: 59

## 2019-12-25 ENCOUNTER — Encounter (HOSPITAL_COMMUNITY): Payer: Self-pay | Admitting: Emergency Medicine

## 2019-12-25 ENCOUNTER — Inpatient Hospital Stay (HOSPITAL_COMMUNITY)
Admission: EM | Admit: 2019-12-25 | Discharge: 2019-12-28 | DRG: 572 | Disposition: A | Discharge status: Home / Self Care | Payer: 59 | Attending: Internal Medicine | Admitting: Internal Medicine

## 2019-12-25 DIAGNOSIS — Z20822 Contact with and (suspected) exposure to covid-19: Secondary | ICD-10-CM | POA: Diagnosis present

## 2019-12-25 DIAGNOSIS — F419 Anxiety disorder, unspecified: Secondary | ICD-10-CM | POA: Diagnosis present

## 2019-12-25 DIAGNOSIS — Z880 Allergy status to penicillin: Secondary | ICD-10-CM

## 2019-12-25 DIAGNOSIS — R7989 Other specified abnormal findings of blood chemistry: Secondary | ICD-10-CM | POA: Diagnosis present

## 2019-12-25 DIAGNOSIS — I10 Essential (primary) hypertension: Secondary | ICD-10-CM | POA: Diagnosis present

## 2019-12-25 DIAGNOSIS — L0390 Cellulitis, unspecified: Secondary | ICD-10-CM | POA: Diagnosis present

## 2019-12-25 DIAGNOSIS — M659 Synovitis and tenosynovitis, unspecified: Secondary | ICD-10-CM | POA: Diagnosis present

## 2019-12-25 DIAGNOSIS — F329 Major depressive disorder, single episode, unspecified: Secondary | ICD-10-CM | POA: Diagnosis present

## 2019-12-25 DIAGNOSIS — L03113 Cellulitis of right upper limb: Secondary | ICD-10-CM | POA: Diagnosis present

## 2019-12-25 DIAGNOSIS — B9561 Methicillin susceptible Staphylococcus aureus infection as the cause of diseases classified elsewhere: Secondary | ICD-10-CM | POA: Diagnosis present

## 2019-12-25 DIAGNOSIS — Z885 Allergy status to narcotic agent status: Secondary | ICD-10-CM | POA: Diagnosis not present

## 2019-12-25 DIAGNOSIS — Z8619 Personal history of other infectious and parasitic diseases: Secondary | ICD-10-CM | POA: Diagnosis present

## 2019-12-25 DIAGNOSIS — Z87891 Personal history of nicotine dependence: Secondary | ICD-10-CM

## 2019-12-25 DIAGNOSIS — L02413 Cutaneous abscess of right upper limb: Principal | ICD-10-CM | POA: Diagnosis present

## 2019-12-25 LAB — COMPREHENSIVE METABOLIC PANEL
ALT: 393 U/L — ABNORMAL HIGH (ref 0–44)
AST: 193 U/L — ABNORMAL HIGH (ref 15–41)
Albumin: 3.6 g/dL (ref 3.5–5.0)
Alkaline Phosphatase: 89 U/L (ref 38–126)
Anion gap: 12 (ref 5–15)
BUN: 11 mg/dL (ref 6–20)
CO2: 24 mmol/L (ref 22–32)
Calcium: 8.8 mg/dL — ABNORMAL LOW (ref 8.9–10.3)
Chloride: 100 mmol/L (ref 98–111)
Creatinine, Ser: 1.03 mg/dL (ref 0.61–1.24)
GFR calc Af Amer: >60 mL/min (ref >60)
GFR calc non Af Amer: >60 mL/min (ref >60)
Glucose, Bld: 113 mg/dL — ABNORMAL HIGH (ref 70–99)
Potassium: 3.7 mmol/L (ref 3.5–5.1)
Sodium: 136 mmol/L (ref 135–145)
Total Bilirubin: 0.5 mg/dL (ref 0.3–1.2)
Total Protein: 7.3 g/dL (ref 6.5–8.1)

## 2019-12-25 LAB — CBC WITH DIFFERENTIAL/PLATELET
Abs Immature Granulocytes: 0.05 10*3/uL (ref 0.00–0.07)
Basophils Absolute: 0.1 10*3/uL (ref 0.0–0.1)
Basophils Relative: 0 %
Eosinophils Absolute: 0.1 10*3/uL (ref 0.0–0.5)
Eosinophils Relative: 1 %
HCT: 50.2 % (ref 39.0–52.0)
Hemoglobin: 17.1 g/dL — ABNORMAL HIGH (ref 13.0–17.0)
Immature Granulocytes: 0 %
Lymphocytes Relative: 20 %
Lymphs Abs: 2.7 10*3/uL (ref 0.7–4.0)
MCH: 31.7 pg (ref 26.0–34.0)
MCHC: 34.1 g/dL (ref 30.0–36.0)
MCV: 93.1 fL (ref 80.0–100.0)
Monocytes Absolute: 1.5 10*3/uL — ABNORMAL HIGH (ref 0.1–1.0)
Monocytes Relative: 11 %
Neutro Abs: 9 10*3/uL — ABNORMAL HIGH (ref 1.7–7.7)
Neutrophils Relative %: 68 %
Platelets: 208 10*3/uL (ref 150–400)
RBC: 5.39 MIL/uL (ref 4.22–5.81)
RDW: 12.4 % (ref 11.5–15.5)
WBC: 13.5 10*3/uL — ABNORMAL HIGH (ref 4.0–10.5)
nRBC: 0 % (ref 0.0–0.2)

## 2019-12-25 LAB — LACTIC ACID, PLASMA
Lactic Acid, Venous: 1.5 mmol/L (ref 0.5–1.9)
Lactic Acid, Venous: 1.7 mmol/L (ref 0.5–1.9)

## 2019-12-25 MED ORDER — VANCOMYCIN HCL 1500 MG/300ML IV SOLN
1500.0000 mg | Freq: Two times a day (BID) | INTRAVENOUS | Status: DC
  Administered 2019-12-26 – 2019-12-28 (×4): 1500 mg via INTRAVENOUS
  Filled 2019-12-25 (×6): qty 300

## 2019-12-25 MED ORDER — VANCOMYCIN HCL 2000 MG/400ML IV SOLN
2000.0000 mg | Freq: Once | INTRAVENOUS | Status: AC
  Administered 2019-12-25: 2000 mg via INTRAVENOUS
  Filled 2019-12-25: qty 400
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
## 2019-12-25 NOTE — Progress Notes (Signed)
Pharmacy Antibiotic Note  Timothy Ramsey is a 54 y.o. male admitted on 12/25/2019 with cellulitis.  Pharmacy has been consulted for vancomycin dosing.  Plan: Vancomycin 2gm IV x 1 then 1500 mg IV q12 F/u renal function, cultures and clinical course  Height: 6' (182.9 cm) Weight: 220 lb 7.4 oz (100 kg) IBW/kg (Calculated) : 77.6  Temp (24hrs), Avg:98.6 F (37 C), Min:98.6 F (37 C), Max:98.6 F (37 C)  Recent Labs  Lab 12/25/19 2026 12/25/19 2027  WBC 13.5*  --   CREATININE 1.03  --   LATICACIDVEN  --  1.5    Estimated Creatinine Clearance: 103.9 mL/min (by C-G formula based on SCr of 1.03 mg/dL).    No Known Allergies  Thank you for allowing pharmacy to be a part of this patient's care.  Talbert Cage Poteet 12/25/2019 11:00 PM

## 2019-12-25 NOTE — ED Triage Notes (Signed)
Patient reports worsening right hand redness/swelling with drainage onset last week , he noticed swelling is spreading to his forearm , currently taking oral antibiotic with no improvement .
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
## 2019-12-25 NOTE — ED Notes (Signed)
MD & Resident at bedside

## 2019-12-26 ENCOUNTER — Inpatient Hospital Stay (HOSPITAL_COMMUNITY): Payer: 59

## 2019-12-26 ENCOUNTER — Encounter (HOSPITAL_COMMUNITY): Payer: Self-pay | Admitting: Internal Medicine

## 2019-12-26 DIAGNOSIS — R7989 Other specified abnormal findings of blood chemistry: Secondary | ICD-10-CM | POA: Diagnosis present

## 2019-12-26 LAB — RAPID URINE DRUG SCREEN, HOSP PERFORMED
Amphetamines: NOT DETECTED
Barbiturates: NOT DETECTED
Benzodiazepines: NOT DETECTED
Cocaine: NOT DETECTED
Opiates: NOT DETECTED
Tetrahydrocannabinol: NOT DETECTED

## 2019-12-26 LAB — SURGICAL PCR SCREEN
MRSA, PCR: POSITIVE — AB
Staphylococcus aureus: POSITIVE — AB

## 2019-12-26 LAB — COMPREHENSIVE METABOLIC PANEL
ALT: 359 U/L — ABNORMAL HIGH (ref 0–44)
AST: 171 U/L — ABNORMAL HIGH (ref 15–41)
Albumin: 3.1 g/dL — ABNORMAL LOW (ref 3.5–5.0)
Alkaline Phosphatase: 79 U/L (ref 38–126)
Anion gap: 11 (ref 5–15)
BUN: 13 mg/dL (ref 6–20)
CO2: 27 mmol/L (ref 22–32)
Calcium: 8.9 mg/dL (ref 8.9–10.3)
Chloride: 100 mmol/L (ref 98–111)
Creatinine, Ser: 1.11 mg/dL (ref 0.61–1.24)
GFR calc Af Amer: >60 mL/min (ref >60)
GFR calc non Af Amer: >60 mL/min (ref >60)
Glucose, Bld: 148 mg/dL — ABNORMAL HIGH (ref 70–99)
Potassium: 3.9 mmol/L (ref 3.5–5.1)
Sodium: 138 mmol/L (ref 135–145)
Total Bilirubin: 1.3 mg/dL — ABNORMAL HIGH (ref 0.3–1.2)
Total Protein: 6.8 g/dL (ref 6.5–8.1)

## 2019-12-26 LAB — CK: Total CK: 132 U/L (ref 49–397)

## 2019-12-26 LAB — CBC
HCT: 46.3 % (ref 39.0–52.0)
Hemoglobin: 15.9 g/dL (ref 13.0–17.0)
MCH: 31.9 pg (ref 26.0–34.0)
MCHC: 34.3 g/dL (ref 30.0–36.0)
MCV: 93 fL (ref 80.0–100.0)
Platelets: 187 10*3/uL (ref 150–400)
RBC: 4.98 MIL/uL (ref 4.22–5.81)
RDW: 12.6 % (ref 11.5–15.5)
WBC: 11.9 10*3/uL — ABNORMAL HIGH (ref 4.0–10.5)
nRBC: 0 % (ref 0.0–0.2)

## 2019-12-26 LAB — LIPID PANEL
Cholesterol: 141 mg/dL (ref 0–200)
HDL: 73 mg/dL (ref >40)
LDL Cholesterol: 58 mg/dL (ref 0–99)
Total CHOL/HDL Ratio: 1.9 RATIO
Triglycerides: 49 mg/dL (ref <150)
VLDL: 10 mg/dL (ref 0–40)

## 2019-12-26 LAB — TSH: TSH: 1.978 u[IU]/mL (ref 0.350–4.500)

## 2019-12-26 LAB — HEPATITIS PANEL, ACUTE
HCV Ab: REACTIVE — AB
Hep A IgM: NONREACTIVE
Hep B C IgM: NONREACTIVE
Hepatitis B Surface Ag: NONREACTIVE

## 2019-12-26 LAB — HIV ANTIBODY (ROUTINE TESTING W REFLEX): HIV Screen 4th Generation wRfx: NONREACTIVE

## 2019-12-26 LAB — SEDIMENTATION RATE: Sed Rate: 14 mm/hr (ref 0–16)

## 2019-12-26 LAB — PROCALCITONIN: Procalcitonin: <0.1 ng/mL

## 2019-12-26 LAB — SARS CORONAVIRUS 2 (TAT 6-24 HRS): SARS Coronavirus 2: NEGATIVE

## 2019-12-26 LAB — MAGNESIUM: Magnesium: 1.9 mg/dL (ref 1.7–2.4)

## 2019-12-26 LAB — C-REACTIVE PROTEIN: CRP: 2.9 mg/dL — ABNORMAL HIGH (ref <1.0)

## 2019-12-26 LAB — PROTIME-INR
INR: 1 (ref 0.8–1.2)
Prothrombin Time: 12.9 seconds (ref 11.4–15.2)

## 2019-12-26 LAB — PHOSPHORUS: Phosphorus: 3.1 mg/dL (ref 2.5–4.6)

## 2019-12-26 MED ORDER — AMLODIPINE BESYLATE 10 MG PO TABS
10.0000 mg | ORAL_TABLET | Freq: Every day | ORAL | Status: DC
  Administered 2019-12-26: 10 mg via ORAL
  Filled 2019-12-26: qty 1

## 2019-12-26 MED ORDER — ONDANSETRON HCL 4 MG PO TABS: 4.0000 mg | ORAL_TABLET | Freq: Four times a day (QID) | ORAL | Status: DC | PRN

## 2019-12-26 MED ORDER — SODIUM CHLORIDE 0.9 % IV SOLN: INTRAVENOUS | Status: DC

## 2019-12-26 MED ORDER — TOBRAMYCIN 0.3 % OP OINT
TOPICAL_OINTMENT | Freq: Two times a day (BID) | OPHTHALMIC | Status: DC
  Filled 2019-12-26 (×2): qty 3.5

## 2019-12-26 MED ORDER — TRAMADOL HCL 50 MG PO TABS
50.0000 mg | ORAL_TABLET | Freq: Four times a day (QID) | ORAL | Status: DC | PRN
  Administered 2019-12-26 – 2019-12-27 (×3): 50 mg via ORAL
  Filled 2019-12-26 (×3): qty 1

## 2019-12-26 MED ORDER — MUPIROCIN 2 % EX OINT
1.0000 "application " | TOPICAL_OINTMENT | Freq: Two times a day (BID) | CUTANEOUS | Status: DC
  Administered 2019-12-26 – 2019-12-27 (×4): 1 via NASAL
  Filled 2019-12-26: qty 22

## 2019-12-26 MED ORDER — MIRTAZAPINE 15 MG PO TABS
15.0000 mg | ORAL_TABLET | Freq: Every day | ORAL | Status: DC
  Administered 2019-12-26 – 2019-12-27 (×2): 15 mg via ORAL
  Filled 2019-12-26 (×2): qty 1

## 2019-12-26 MED ORDER — BUPROPION HCL ER (XL) 150 MG PO TB24
150.0000 mg | ORAL_TABLET | Freq: Every day | ORAL | Status: DC
  Administered 2019-12-26: 150 mg via ORAL
  Administered 2019-12-27: 450 mg via ORAL
  Filled 2019-12-26 (×3): qty 1

## 2019-12-26 MED ORDER — AMLODIPINE BESYLATE 10 MG PO TABS
10.0000 mg | ORAL_TABLET | Freq: Every day | ORAL | Status: DC
  Administered 2019-12-27 – 2019-12-28 (×2): 10 mg via ORAL
  Filled 2019-12-26 (×2): qty 1

## 2019-12-26 MED ORDER — CHLORHEXIDINE GLUCONATE CLOTH 2 % EX PADS
6.0000 | MEDICATED_PAD | Freq: Every day | CUTANEOUS | Status: DC
  Administered 2019-12-26 – 2019-12-27 (×2): 6 via TOPICAL

## 2019-12-26 MED ORDER — HEPARIN SODIUM (PORCINE) 5000 UNIT/ML IJ SOLN
5000.0000 [IU] | Freq: Three times a day (TID) | INTRAMUSCULAR | Status: DC
  Administered 2019-12-26 – 2019-12-28 (×5): 5000 [IU] via SUBCUTANEOUS
  Filled 2019-12-26 (×5): qty 1

## 2019-12-26 MED ORDER — ONDANSETRON HCL 4 MG/2ML IJ SOLN: 4.0000 mg | Freq: Four times a day (QID) | INTRAMUSCULAR | Status: DC | PRN

## 2019-12-26 MED ORDER — PRO-STAT SUGAR FREE PO LIQD
30.0000 mL | Freq: Three times a day (TID) | ORAL | Status: DC
  Administered 2019-12-26 – 2019-12-28 (×2): 30 mL via ORAL
  Filled 2019-12-26 (×2): qty 30
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
## 2019-12-26 NOTE — Progress Notes (Signed)
ATTEMPTED TO SEE PATIENT PT NOT IN ROOM NOTES REVIEWED

## 2019-12-26 NOTE — Progress Notes (Signed)
Physical Therapy Evaluation Patient Details Name: Timothy Ramsey MRN: [id number] DOB: May 22, 1966 Today's Date: 12/26/2019   History of Present Illness  Pt is a 54 y/o male admitted with R hand infection and cellulitis. He is left-hand dominant. Past Medical History: No date: Addiction to drug [facility]) No date: Anxiety No date: Depression No date: Hepatitis C   Clinical Impression  Patient evaluated by Physical Therapy with no further acute PT needs identified. Patient walked comfortably while conversing; reported 3/10 pain in right hand for 500 ft with SpO2 of 99% on room air. All education has been completed and the patient has no further questions. No post-acute rehab is indicated for this patient at this time. PT is signing off. Thank you for this referral.    Follow Up Recommendations No PT follow up, Dr. Caralyn Guile to determine if patient may benefit from hand therapy in future    Equipment Recommendations  None recommended by PT    Recommendations for Other Services       Precautions / Restrictions Precautions Precautions: None Restrictions Weight Bearing Restrictions: No      Mobility  Bed Mobility Overal bed mobility: Independent                Transfers Overall transfer level: Independent Equipment used: None                Ambulation/Gait Ambulation/Gait assistance: Independent Gait Distance (Feet): 500 Feet Assistive device: None Gait Pattern/deviations: WFL(Within Functional Limits)        Stairs            Wheelchair Mobility    Modified Rankin (Stroke Patients Only)       Balance Overall balance assessment: Independent                                           Pertinent Vitals/Pain Pain Assessment: 0-10 Pain Score: 3  Pain Location: right hand    Home Living Family/patient expects to be discharged to:: Private residence Living Arrangements: Alone   Type of Home: House Home Access: Stairs to  enter Entrance Stairs-Rails: Psychiatric nurse of Steps: 4-5 Home Layout: One level        Prior Function Level of Independence: Independent         Comments: Runs lifts weights daily; enjoys hiking     Hand Dominance   Dominant Hand: Left    Extremity/Trunk Assessment   Upper Extremity Assessment Upper Extremity Assessment: Defer to OT evaluation    Lower Extremity Assessment Lower Extremity Assessment: Overall WFL for tasks assessed    Cervical / Trunk Assessment Cervical / Trunk Assessment: Normal  Communication   Communication: No difficulties  Cognition Arousal/Alertness: Awake/alert Behavior During Therapy: WFL for tasks assessed/performed Overall Cognitive Status: Within Functional Limits for tasks assessed                                        General Comments      Exercises     Assessment/Plan    PT Assessment Patent does not need any further PT services  PT Problem List         PT Treatment Interventions      PT Goals (Current goals can be found in the Care Plan section)  Frequency     Barriers to discharge        Co-evaluation               AM-PAC PT "6 Clicks" Mobility  Outcome Measure Help needed turning from your back to your side while in a flat bed without using bedrails?: None Help needed moving from lying on your back to sitting on the side of a flat bed without using bedrails?: None Help needed moving to and from a bed to a chair (including a wheelchair)?: None Help needed standing up from a chair using your arms (e.g., wheelchair or bedside chair)?: None Help needed to walk in hospital room?: None Help needed climbing 3-5 steps with a railing? : None 6 Click Score: 24    End of Session   Activity Tolerance: Patient tolerated treatment well Patient left: Other (comment)(on couch reading a book, IV pole is unplugged; pt educated on battery life of IV pump)        Time:  1555(Simultaneous filing. User may not have seen previous data.)-1615(Simultaneous filing. User may not have seen previous data.) PT Time Calculation (min) (ACUTE ONLY): 20 min(Simultaneous filing. User may not have seen previous data.)   Charges:   PT Evaluation $PT Eval Low Complexity: 1 Low(Simultaneous filing. User may not have seen previous data.)          Francene Castle, SPT Acute Rehab Office [phone]  Francene Castle 12/26/2019, 4:32 PM

## 2019-12-26 NOTE — Plan of Care (Signed)
  Problem: Education: Goal: Knowledge of General Education information will improve Description: Including pain rating scale, medication(s)/side effects and non-pharmacologic comfort measures Outcome: Progressing   Problem: Clinical Measurements: Goal: Respiratory complications will improve Outcome: Progressing Note: On room air   Problem: Activity: Goal: Risk for activity intolerance will decrease Outcome: Progressing Note: Independent in room , also walked hallways with PT, tolerated well   Problem: Nutrition: Goal: Adequate nutrition will be maintained Outcome: Progressing   Problem: Coping: Goal: Level of anxiety will decrease Outcome: Progressing   Problem: Elimination: Goal: Will not experience complications related to urinary retention Outcome: Progressing   Problem: Pain Managment: Goal: General experience of comfort will improve Outcome: Progressing Note: Pain in right hand rating at a 3 out of 10, not requiring pain medication   Problem: Safety: Goal: Ability to remain free from injury will improve Outcome: Progressing

## 2019-12-26 NOTE — Progress Notes (Addendum)
PROGRESS NOTE                                                                                                                                                                                                             Patient Demographics:    Timothy Ramsey, is a 54 y.o. male, DOB - 1965-12-31, OAC:[id number]  Admit date - 12/25/2019   Admitting Physician Therisa Doyne, MD  Outpatient Primary MD for the patient is Zola Button, Grayling Congress, DO  LOS - 1  Chief Complaint  Patient presents with  . Hand Infection / Cellulitis       Brief Narrative  Timothy Ramsey is a 54 y.o. male with medical history significant of HTN, hep C past hx of IV drug use, prior hand cellulitis in 2019, who presents with right hand pain and swelling diagnosed with cellulitis outpatient after he got his right hand injured moving furniture to furniture 1 week ago he says it could have been a splinter or a bug bite, he got Bactrim outpatient however his hand kept on getting worse so he came to the hospital where he was diagnosed with right hand cellulitis and admitted for further treatment.  Hand surgery was consulted.   Subjective:    Timothy Ramsey today has, No headache, No chest pain, No abdominal pain - No Nausea, No new weakness tingling or numbness, No Cough - SOB.  Mild right hand pain and discomfort.   Assessment  & Plan :     1.  Right hand cellulitis.  Failed outpatient Bactrim treatment, question if this is a spider bite.  For now keep the extremity elevated, agree with IV antibiotics, follow cultures and procalcitonin, have consulted Dr. Orlan Leavens hand surgeon who will see the patient shortly.  May require further imaging.  2.  History of treated hep C.  Mildly elevated LFTs.  Asymptomatic, trend, right upper ordered ultrasound appears stable.  3.  Essential hypertension.  On Norvasc.  4.  Depression.  On Wellbutrin.  Remote history of IV drug use quit more than 5 years ago.  Now  clean.  Hepatic Function Panel     Component Value Date/Time   PROT 6.8 12/26/2019 0353   ALBUMIN 3.1 (L) 12/26/2019 0353   AST 171 (H) 12/26/2019 0353   ALT 359 (H) 12/26/2019 0353   ALKPHOS 79 12/26/2019 0353   BILITOT 1.3 (H) 12/26/2019 0353    Lab Results  Component Value Date   INR 1.0 12/26/2019  Family Communication  :   None  Code Status :  Full  Disposition Plan  : In the hospital for the treatment of severe right hand cellulitis.  Consults  : Hand surgeon Dr. Lamount Cohen  Procedures  :    Liver ultrasound. Common bile duct is mildly dilated proximally at 8 mm, but no intrahepatic biliary dilatation is noted. Distal portion cannot be visualized due to overlying bowel gas. Correlation with liver function tests is recommended to rule out biliary obstruction. No other abnormality seen in the right upper quadrant of the abdomen.  DVT Prophylaxis  :   Heparin    Lab Results  Component Value Date   PLT 187 12/26/2019    Diet :  Diet Order            Diet regular Room service appropriate? Yes; Fluid consistency: Thin  Diet effective now               Inpatient Medications Scheduled Meds: . amLODipine  10 mg Oral Daily  . amLODipine  10 mg Oral Daily  . buPROPion  150 mg Oral Daily  . Chlorhexidine Gluconate Cloth  6 each Topical Q0600  . mirtazapine  15 mg Oral QHS  . mupirocin ointment  1 application Nasal BID  . tobramycin   Left Eye BID   Continuous Infusions: . sodium chloride 75 mL/hr at 12/26/19 0203  . vancomycin     PRN Meds:.ondansetron **OR** ondansetron (ZOFRAN) IV, traMADol  Antibiotics  :   Anti-infectives (From admission, onward)   Start     Dose/Rate Route Frequency Ordered Stop   12/26/19 1200  vancomycin (VANCOREADY) IVPB 1500 mg/300 mL     1,500 mg 150 mL/hr over 120 Minutes Intravenous Every 12 hours 12/25/19 2325     12/25/19 2245  vancomycin (VANCOREADY) IVPB 2000 mg/400 mL     2,000 mg 200 mL/hr over 120 Minutes  Intravenous  Once 12/25/19 2241 12/26/19 0204          Objective:   Vitals:   12/26/19 0030 12/26/19 0100 12/26/19 0200 12/26/19 0930  BP: (!) 146/82 (!) 151/91 (!) 154/96 (!) 154/100  Pulse: 80 72 74 78  Resp:   18 18  Temp:   98.2 F (36.8 C) 98.1 F (36.7 C)  TempSrc:    Oral  SpO2: 97% 97% 97% 97%  Weight:      Height:        SpO2: 97 %  Wt Readings from Last 3 Encounters:  12/25/19 100 kg     Intake/Output Summary (Last 24 hours) at 12/26/2019 1016 Last data filed at 12/26/2019 0339 Gross per 24 hour  Intake 522.37 ml  Output --  Net 522.37 ml     Physical Exam  Awake Alert, No new F.N deficits, Normal affect [place].AT,PERRAL Supple Neck,No JVD, No cervical lymphadenopathy appriciated.  Symmetrical Chest wall movement, Good air movement bilaterally, CTAB RRR,No Gallops,Rubs or new Murmurs, No Parasternal Heave +ve B.Sounds, Abd Soft, No tenderness, No organomegaly appriciated, No rebound - guarding or rigidity. No Cyanosis,  R hand is swollen warm n red picture from admission 12/25/19 below        Data Review:    Recent Labs  Lab 12/25/19 2026 12/26/19 0353  WBC 13.5* 11.9*  HGB 17.1* 15.9  HCT 50.2 46.3  PLT 208 187  MCV 93.1 93.0  MCH 31.7 31.9  MCHC 34.1 34.3  RDW 12.4 12.6  LYMPHSABS 2.7  --   MONOABS 1.5*  --  EOSABS 0.1  --   BASOSABS 0.1  --     Recent Labs  Lab 12/25/19 2026 12/26/19 0353  NA 136 138  K 3.7 3.9  CL 100 100  CO2 24 27  GLUCOSE 113* 148*  BUN 11 13  CREATININE 1.03 1.11  CALCIUM 8.8* 8.9  AST 193* 171*  ALT 393* 359*  ALKPHOS 89 79  BILITOT 0.5 1.3*  ALBUMIN 3.6 3.1*  MG  --  1.9  CRP  --  2.9*  INR  --  1.0  TSH  --  1.978    Recent Labs  Lab 12/26/19 0111 12/26/19 0353  CRP  --  2.9*  SARSCOV2NAA NEGATIVE  --     ------------------------------------------------------------------------------------------------------------------ Recent Labs    12/26/19 0353  CHOL 141  HDL 73  LDLCALC  58  TRIG 49  CHOLHDL 1.9    No results found for: HGBA1C ------------------------------------------------------------------------------------------------------------------ Recent Labs    12/26/19 0353  TSH 1.978   ------------------------------------------------------------------------------------------------------------------ No results for input(s): VITAMINB12, FOLATE, FERRITIN, TIBC, IRON, RETICCTPCT in the last 72 hours.  Coagulation profile Recent Labs  Lab 12/26/19 0353  INR 1.0    No results for input(s): DDIMER in the last 72 hours.  Cardiac Enzymes No results for input(s): CKMB, TROPONINI, MYOGLOBIN in the last 168 hours.  Invalid input(s): CK ------------------------------------------------------------------------------------------------------------------ No results found for: BNP  Micro Results Recent Results (from the past 240 hour(s))  Blood culture (routine x 2)     Status: None (Preliminary result)   Collection Time: 12/25/19  8:27 PM   Specimen: BLOOD LEFT ARM  Result Value Ref Range Status   Specimen Description BLOOD LEFT ARM  Final   Special Requests   Final    BOTTLES DRAWN AEROBIC AND ANAEROBIC Blood Culture adequate volume   Culture   Final    NO GROWTH < 12 HOURS Performed at [facility], [address], [place], [place] [address]    Report Status PENDING  Incomplete  Blood culture (routine x 2)     Status: None (Preliminary result)   Collection Time: 12/25/19  8:28 PM   Specimen: BLOOD LEFT HAND  Result Value Ref Range Status   Specimen Description BLOOD LEFT HAND  Final   Special Requests   Final    BOTTLES DRAWN AEROBIC ONLY Blood Culture adequate volume   Culture   Final    NO GROWTH < 12 HOURS Performed at [facility], [address] [place], [place] [address]    Report Status PENDING  Incomplete  SARS CORONAVIRUS 2 (TAT 6-24 HRS) Nasopharyngeal Nasopharyngeal Swab     Status: None   Collection Time: 12/26/19  1:11  AM   Specimen: Nasopharyngeal Swab  Result Value Ref Range Status   SARS Coronavirus 2 NEGATIVE NEGATIVE Final    Comment: (NOTE) SARS-CoV-2 target nucleic acids are NOT DETECTED. The SARS-CoV-2 RNA is generally detectable in upper and lower respiratory specimens during the acute phase of infection. Negative results do not preclude SARS-CoV-2 infection, do not rule out co-infections with other pathogens, and should not be used as the sole basis for treatment or other patient management decisions. Negative results must be combined with clinical observations, patient history, and epidemiological information. The expected result is Negative. Fact Sheet for Patients: HairSlick.no Fact Sheet for Healthcare Providers: quierodirigir.com This test is not yet approved or cleared by the [place] FDA and  has been authorized for detection and/or diagnosis of SARS-CoV-2 by FDA under an  Emergency Use Authorization (EUA). This EUA will remain  in effect (meaning this test can be used) for the duration of the COVID-19 declaration under Section 56 4(b)(1) of the Act, 21 U.S.C. section 360bbb-3(b)(1), unless the authorization is terminated or revoked sooner. Performed at [facility], [address] [place], [place] [address]   Surgical pcr screen     Status: Abnormal   Collection Time: 12/26/19  2:22 AM   Specimen: Nasal Mucosa; Nasal Swab  Result Value Ref Range Status   MRSA, PCR POSITIVE (A) NEGATIVE Final    Comment: RESULT CALLED TO, READ BACK BY AND VERIFIED WITH: VANDYKE,S RN [phone] 12/26/2019 MITCHELL,L    Staphylococcus aureus POSITIVE (A) NEGATIVE Final    Comment: (NOTE) The Xpert SA Assay (FDA approved for NASAL specimens in patients 40 years of age and older), is one component of a comprehensive surveillance program. It is not intended to diagnose infection nor to guide or monitor treatment.     Radiology  Reports DG Hand Complete Right  Result Date: 12/25/2019 CLINICAL DATA:  Swelling EXAM: RIGHT HAND - COMPLETE 3+ VIEW COMPARISON:  December 25, 2019 FINDINGS: There is extensive soft tissue swelling about the hand. There is no acute displaced fracture or dislocation. There is no radiopaque foreign body or subcutaneous gas. There is no radiographic evidence for osteomyelitis. IMPRESSION: No acute osseous abnormality. Extensive soft tissue swelling about the hand. Electronically Signed   By: Katherine Mantle M.D.   On: 12/25/2019 21:26   US Abdomen Limited RUQ  Result Date: 12/26/2019 CLINICAL DATA:  Elevated liver function tests. EXAM: ULTRASOUND ABDOMEN LIMITED RIGHT UPPER QUADRANT COMPARISON:  June 16, 2018. FINDINGS: Gallbladder: No gallstones or wall thickening visualized. No sonographic Murphy sign noted by sonographer. Common bile duct: Diameter: 8 mm proximal which is mildly dilated, but no intrahepatic biliary dilatation is noted. Distal portion could not be visualized due to overlying bowel gas. Liver: No focal lesion identified. Within normal limits in parenchymal echogenicity. Portal vein is patent on color Doppler imaging with normal direction of blood flow towards the liver. Other: None. IMPRESSION: Common bile duct is mildly dilated proximally at 8 mm, but no intrahepatic biliary dilatation is noted. Distal portion cannot be visualized due to overlying bowel gas. Correlation with liver function tests is recommended to rule out biliary obstruction. No other abnormality seen in the right upper quadrant of the abdomen. Electronically Signed   By: Lupita Raider M.D.   On: 12/26/2019 08:37    Time Spent in minutes  30   Susa Raring M.D on 12/26/2019 at 10:16 AM  To page go to [email or web address] - password [email or web address]

## 2019-12-27 ENCOUNTER — Encounter (HOSPITAL_COMMUNITY): Payer: Self-pay | Admitting: Internal Medicine

## 2019-12-27 ENCOUNTER — Encounter (HOSPITAL_COMMUNITY)
Admission: EM | Disposition: A | Discharge status: Home / Self Care | Payer: Self-pay | Source: Home / Self Care | Attending: Internal Medicine

## 2019-12-27 ENCOUNTER — Inpatient Hospital Stay (HOSPITAL_COMMUNITY): Payer: 59 | Admitting: Anesthesiology

## 2019-12-27 HISTORY — PX: I & D EXTREMITY: SHX5045

## 2019-12-27 LAB — CBC WITH DIFFERENTIAL/PLATELET
Abs Immature Granulocytes: 0.04 10*3/uL (ref 0.00–0.07)
Basophils Absolute: 0 10*3/uL (ref 0.0–0.1)
Basophils Relative: 0 %
Eosinophils Absolute: 0.2 10*3/uL (ref 0.0–0.5)
Eosinophils Relative: 2 %
HCT: 50.8 % (ref 39.0–52.0)
Hemoglobin: 16.8 g/dL (ref 13.0–17.0)
Immature Granulocytes: 0 %
Lymphocytes Relative: 33 %
Lymphs Abs: 3 10*3/uL (ref 0.7–4.0)
MCH: 31.3 pg (ref 26.0–34.0)
MCHC: 33.1 g/dL (ref 30.0–36.0)
MCV: 94.8 fL (ref 80.0–100.0)
Monocytes Absolute: 1 10*3/uL (ref 0.1–1.0)
Monocytes Relative: 11 %
Neutro Abs: 4.9 10*3/uL (ref 1.7–7.7)
Neutrophils Relative %: 54 %
Platelets: 197 10*3/uL (ref 150–400)
RBC: 5.36 MIL/uL (ref 4.22–5.81)
RDW: 12.9 % (ref 11.5–15.5)
WBC: 9.1 10*3/uL (ref 4.0–10.5)
nRBC: 0 % (ref 0.0–0.2)

## 2019-12-27 LAB — COMPREHENSIVE METABOLIC PANEL
ALT: 430 U/L — ABNORMAL HIGH (ref 0–44)
AST: 196 U/L — ABNORMAL HIGH (ref 15–41)
Albumin: 2.9 g/dL — ABNORMAL LOW (ref 3.5–5.0)
Alkaline Phosphatase: 81 U/L (ref 38–126)
Anion gap: 10 (ref 5–15)
BUN: 11 mg/dL (ref 6–20)
CO2: 24 mmol/L (ref 22–32)
Calcium: 9 mg/dL (ref 8.9–10.3)
Chloride: 105 mmol/L (ref 98–111)
Creatinine, Ser: 0.81 mg/dL (ref 0.61–1.24)
GFR calc Af Amer: >60 mL/min (ref >60)
GFR calc non Af Amer: >60 mL/min (ref >60)
Glucose, Bld: 117 mg/dL — ABNORMAL HIGH (ref 70–99)
Potassium: 4.1 mmol/L (ref 3.5–5.1)
Sodium: 139 mmol/L (ref 135–145)
Total Bilirubin: 0.8 mg/dL (ref 0.3–1.2)
Total Protein: 6.6 g/dL (ref 6.5–8.1)

## 2019-12-27 LAB — HCV RNA QUANT
HCV Quantitative Log: 5.25 log10 IU/mL (ref >1.70)
HCV Quantitative: 178000 IU/mL (ref >50)

## 2019-12-27 LAB — MAGNESIUM: Magnesium: 1.9 mg/dL (ref 1.7–2.4)

## 2019-12-27 LAB — PROCALCITONIN: Procalcitonin: <0.1 ng/mL

## 2019-12-27 LAB — C-REACTIVE PROTEIN: CRP: 2.7 mg/dL — ABNORMAL HIGH (ref <1.0)

## 2019-12-27 SURGERY — IRRIGATION AND DEBRIDEMENT EXTREMITY
Anesthesia: General | Laterality: Right

## 2019-12-27 MED ORDER — HYDRALAZINE HCL 20 MG/ML IJ SOLN
10.0000 mg | Freq: Four times a day (QID) | INTRAMUSCULAR | Status: DC | PRN
  Administered 2019-12-27 (×2): 10 mg via INTRAVENOUS
  Filled 2019-12-27 (×2): qty 1

## 2019-12-27 MED ORDER — 0.9 % SODIUM CHLORIDE (POUR BTL) OPTIME
TOPICAL | Status: DC | PRN
  Administered 2019-12-27: 18:00:00 1000 mL

## 2019-12-27 MED ORDER — MIDAZOLAM HCL 2 MG/2ML IJ SOLN
INTRAMUSCULAR | Status: DC | PRN
  Administered 2019-12-27: 2 mg via INTRAVENOUS

## 2019-12-27 MED ORDER — SODIUM CHLORIDE 0.9 % IR SOLN
Status: DC | PRN
  Administered 2019-12-27: 3000 mL

## 2019-12-27 MED ORDER — POVIDONE-IODINE 10 % EX SWAB: 2.0000 "application " | Freq: Once | CUTANEOUS | Status: DC

## 2019-12-27 MED ORDER — FENTANYL CITRATE (PF) 250 MCG/5ML IJ SOLN
INTRAMUSCULAR | Status: AC
  Filled 2019-12-27: qty 5

## 2019-12-27 MED ORDER — EPHEDRINE 5 MG/ML INJ
INTRAVENOUS | Status: AC
  Filled 2019-12-27: qty 10

## 2019-12-27 MED ORDER — PROMETHAZINE HCL 25 MG/ML IJ SOLN: 6.2500 mg | INTRAMUSCULAR | Status: DC | PRN

## 2019-12-27 MED ORDER — KETOROLAC TROMETHAMINE 30 MG/ML IJ SOLN
INTRAMUSCULAR | Status: DC | PRN
  Administered 2019-12-27: 30 mg via INTRAVENOUS

## 2019-12-27 MED ORDER — LIDOCAINE 2% (20 MG/ML) 5 ML SYRINGE
INTRAMUSCULAR | Status: DC | PRN
  Administered 2019-12-27: 100 mg via INTRAVENOUS

## 2019-12-27 MED ORDER — LIDOCAINE 2% (20 MG/ML) 5 ML SYRINGE
INTRAMUSCULAR | Status: AC
  Filled 2019-12-27: qty 5

## 2019-12-27 MED ORDER — PHENYLEPHRINE 40 MCG/ML (10ML) SYRINGE FOR IV PUSH (FOR BLOOD PRESSURE SUPPORT)
PREFILLED_SYRINGE | INTRAVENOUS | Status: DC | PRN
  Administered 2019-12-27 (×2): 80 ug via INTRAVENOUS
  Administered 2019-12-27 (×2): 120 ug via INTRAVENOUS

## 2019-12-27 MED ORDER — BUPROPION HCL ER (XL) 150 MG PO TB24
450.0000 mg | ORAL_TABLET | Freq: Every day | ORAL | Status: DC
  Administered 2019-12-28: 450 mg via ORAL
  Filled 2019-12-27: qty 3

## 2019-12-27 MED ORDER — KETOROLAC TROMETHAMINE 30 MG/ML IJ SOLN
INTRAMUSCULAR | Status: AC
  Filled 2019-12-27: qty 1

## 2019-12-27 MED ORDER — CHLORHEXIDINE GLUCONATE 4 % EX LIQD
60.0000 mL | Freq: Once | CUTANEOUS | Status: AC
  Administered 2019-12-27: 4 via TOPICAL

## 2019-12-27 MED ORDER — PROPOFOL 10 MG/ML IV BOLUS
INTRAVENOUS | Status: DC | PRN
  Administered 2019-12-27: 200 mg via INTRAVENOUS

## 2019-12-27 MED ORDER — LACTATED RINGERS IV SOLN: INTRAVENOUS | Status: DC

## 2019-12-27 MED ORDER — FENTANYL CITRATE (PF) 100 MCG/2ML IJ SOLN
25.0000 ug | INTRAMUSCULAR | Status: DC | PRN
  Administered 2019-12-27 (×2): 50 ug via INTRAVENOUS

## 2019-12-27 MED ORDER — EPHEDRINE SULFATE-NACL 50-0.9 MG/10ML-% IV SOSY
PREFILLED_SYRINGE | INTRAVENOUS | Status: DC | PRN
  Administered 2019-12-27 (×4): 10 mg via INTRAVENOUS

## 2019-12-27 MED ORDER — ONDANSETRON HCL 4 MG/2ML IJ SOLN
INTRAMUSCULAR | Status: AC
  Filled 2019-12-27: qty 2

## 2019-12-27 MED ORDER — ONDANSETRON HCL 4 MG/2ML IJ SOLN
INTRAMUSCULAR | Status: DC | PRN
  Administered 2019-12-27: 4 mg via INTRAVENOUS

## 2019-12-27 MED ORDER — MIDAZOLAM HCL 2 MG/2ML IJ SOLN
INTRAMUSCULAR | Status: AC
  Filled 2019-12-27: qty 2

## 2019-12-27 MED ORDER — FENTANYL CITRATE (PF) 100 MCG/2ML IJ SOLN
INTRAMUSCULAR | Status: AC
  Filled 2019-12-27: qty 2

## 2019-12-27 MED ORDER — HYDRALAZINE HCL 50 MG PO TABS
50.0000 mg | ORAL_TABLET | Freq: Three times a day (TID) | ORAL | Status: DC
  Administered 2019-12-27 – 2019-12-28 (×2): 50 mg via ORAL
  Filled 2019-12-27 (×3): qty 1

## 2019-12-27 MED ORDER — DEXAMETHASONE SODIUM PHOSPHATE 10 MG/ML IJ SOLN
INTRAMUSCULAR | Status: DC | PRN
  Administered 2019-12-27: 4 mg via INTRAVENOUS

## 2019-12-27 MED ORDER — DEXAMETHASONE SODIUM PHOSPHATE 10 MG/ML IJ SOLN
INTRAMUSCULAR | Status: AC
  Filled 2019-12-27: qty 1

## 2019-12-27 MED ORDER — FENTANYL CITRATE (PF) 250 MCG/5ML IJ SOLN
INTRAMUSCULAR | Status: DC | PRN
  Administered 2019-12-27: 100 ug via INTRAVENOUS

## 2019-12-27 SURGICAL SUPPLY — 61 items
BNDG ADH 5X3 H2O RPLNT NS (GAUZE/BANDAGES/DRESSINGS) ×1
BNDG COHESIVE 1X5 TAN STRL LF (GAUZE/BANDAGES/DRESSINGS) IMPLANT
BNDG COHESIVE 2X5 WHT NS (GAUZE/BANDAGES/DRESSINGS) ×2 IMPLANT
BNDG COHESIVE 3X5 WHT NS (GAUZE/BANDAGES/DRESSINGS) ×2 IMPLANT
BNDG CONFORM 2 STRL LF (GAUZE/BANDAGES/DRESSINGS) IMPLANT
BNDG ELASTIC 3X5.8 VLCR STR LF (GAUZE/BANDAGES/DRESSINGS) ×2 IMPLANT
BNDG ELASTIC 4X5.8 VLCR STR LF (GAUZE/BANDAGES/DRESSINGS) IMPLANT
BNDG ESMARK 4X9 LF (GAUZE/BANDAGES/DRESSINGS) IMPLANT
BNDG GAUZE ELAST 4 BULKY (GAUZE/BANDAGES/DRESSINGS) ×2 IMPLANT
CORD BIPOLAR FORCEPS 12FT (ELECTRODE) ×2 IMPLANT
COVER SURGICAL LIGHT HANDLE (MISCELLANEOUS) ×2 IMPLANT
COVER WAND RF STERILE (DRAPES) IMPLANT
CUFF TOURN SGL QUICK 18X4 (TOURNIQUET CUFF) ×2 IMPLANT
CUFF TOURN SGL QUICK 24 (TOURNIQUET CUFF)
CUFF TRNQT CYL 24X4X16.5-23 (TOURNIQUET CUFF) IMPLANT
DRAIN PENROSE 1/4X12 LTX STRL (WOUND CARE) IMPLANT
DRAPE SURG 17X23 STRL (DRAPES) IMPLANT
DRSG ADAPTIC 3X8 NADH LF (GAUZE/BANDAGES/DRESSINGS) ×2 IMPLANT
ELECT REM PT RETURN 9FT ADLT (ELECTROSURGICAL)
ELECTRODE REM PT RTRN 9FT ADLT (ELECTROSURGICAL) IMPLANT
GAUZE PACKING IODOFORM 1/4X15 (GAUZE/BANDAGES/DRESSINGS) ×2 IMPLANT
GAUZE SPONGE 4X4 12PLY STRL (GAUZE/BANDAGES/DRESSINGS) ×2 IMPLANT
GAUZE XEROFORM 1X8 LF (GAUZE/BANDAGES/DRESSINGS) IMPLANT
GAUZE XEROFORM 5X9 LF (GAUZE/BANDAGES/DRESSINGS) IMPLANT
GLOVE BIOGEL PI IND STRL 8.5 (GLOVE) ×1 IMPLANT
GLOVE BIOGEL PI INDICATOR 8.5 (GLOVE) ×1
GLOVE SURG ORTHO 8.0 STRL STRW (GLOVE) ×2 IMPLANT
GOWN STRL REUS W/ TWL LRG LVL3 (GOWN DISPOSABLE) ×2 IMPLANT
GOWN STRL REUS W/ TWL XL LVL3 (GOWN DISPOSABLE) IMPLANT
GOWN STRL REUS W/TWL LRG LVL3 (GOWN DISPOSABLE) ×4
GOWN STRL REUS W/TWL XL LVL3 (GOWN DISPOSABLE)
HANDPIECE INTERPULSE COAX TIP (DISPOSABLE)
KIT BASIN OR (CUSTOM PROCEDURE TRAY) ×2 IMPLANT
KIT TURNOVER KIT B (KITS) ×2 IMPLANT
MANIFOLD NEPTUNE II (INSTRUMENTS) ×2 IMPLANT
NEEDLE HYPO 25GX1X1/2 BEV (NEEDLE) IMPLANT
NS IRRIG 1000ML POUR BTL (IV SOLUTION) ×2 IMPLANT
PACK ORTHO EXTREMITY (CUSTOM PROCEDURE TRAY) ×2 IMPLANT
PAD ARMBOARD 7.5X6 YLW CONV (MISCELLANEOUS) ×2 IMPLANT
PAD CAST 3X4 CTTN HI CHSV (CAST SUPPLIES) ×1 IMPLANT
PAD CAST 4YDX4 CTTN HI CHSV (CAST SUPPLIES) ×1 IMPLANT
PADDING CAST COTTON 3X4 STRL (CAST SUPPLIES) ×2
PADDING CAST COTTON 4X4 STRL (CAST SUPPLIES) ×2
SET CYSTO W/LG BORE CLAMP LF (SET/KITS/TRAYS/PACK) ×2 IMPLANT
SET HNDPC FAN SPRY TIP SCT (DISPOSABLE) IMPLANT
SOAP 2 % CHG 4 OZ (WOUND CARE) IMPLANT
SPONGE LAP 18X18 RF (DISPOSABLE) IMPLANT
SPONGE LAP 4X18 RFD (DISPOSABLE) IMPLANT
SUT ETHILON 4 0 PS 2 18 (SUTURE) IMPLANT
SUT ETHILON 5 0 P 3 18 (SUTURE)
SUT NYLON ETHILON 5-0 P-3 1X18 (SUTURE) IMPLANT
SUT PROLENE 3 0 PS 2 (SUTURE) ×2 IMPLANT
SWAB COLLECTION DEVICE MRSA (MISCELLANEOUS) ×2 IMPLANT
SWAB CULTURE ESWAB REG 1ML (MISCELLANEOUS) ×2 IMPLANT
SYR CONTROL 10ML LL (SYRINGE) IMPLANT
TOWEL GREEN STERILE (TOWEL DISPOSABLE) IMPLANT
TOWEL GREEN STERILE FF (TOWEL DISPOSABLE) ×2 IMPLANT
TUBE CONNECTING 12X1/4 (SUCTIONS) ×2 IMPLANT
UNDERPAD 30X30 (UNDERPADS AND DIAPERS) ×2 IMPLANT
WATER STERILE IRR 1000ML POUR (IV SOLUTION) IMPLANT
YANKAUER SUCT BULB TIP NO VENT (SUCTIONS) ×2 IMPLANT

## 2019-12-27 NOTE — Anesthesia Postprocedure Evaluation (Signed)
Anesthesia Post Note  Patient: Timothy Ramsey  Procedure(s) Performed: IRRIGATION AND DEBRIDEMENT RIGHT WRIST (Right )     Patient location during evaluation: PACU Anesthesia Type: General Level of consciousness: awake Pain management: pain level controlled Vital Signs Assessment: post-procedure vital signs reviewed and stable Respiratory status: spontaneous breathing Cardiovascular status: stable Postop Assessment: no apparent nausea or vomiting Anesthetic complications: no    Last Vitals:  Vitals:   12/27/19 1858 12/27/19 1929  BP: (!) 157/98 (!) 141/82  Pulse: 78 95  Resp: 17 18  Temp: 36.8 C 36.7 C  SpO2: 100% 96%    Last Pain:  Vitals:   12/27/19 1929  TempSrc: Oral  PainSc:                  Khalen Styer

## 2019-12-27 NOTE — Anesthesia Preprocedure Evaluation (Addendum)
Anesthesia Evaluation  Patient identified by MRN, date of birth, ID band Patient awake    Reviewed: Allergy & Precautions, NPO status , Patient's Chart, lab work & pertinent test results  Airway Mallampati: II  TM Distance: >3 FB Neck ROM: Full    Dental no notable dental hx.    Pulmonary neg pulmonary ROS, former smoker,    Pulmonary exam normal breath sounds clear to auscultation       Cardiovascular hypertension, Normal cardiovascular exam Rhythm:Regular Rate:Normal     Neuro/Psych PSYCHIATRIC DISORDERS Anxiety Depression negative neurological ROS     GI/Hepatic negative GI ROS, (+)     substance abuse  , Hepatitis -, C  Endo/Other  negative endocrine ROS  Renal/GU negative Renal ROS  negative genitourinary   Musculoskeletal negative musculoskeletal ROS (+)   Abdominal   Peds negative pediatric ROS (+)  Hematology negative hematology ROS (+)   Anesthesia Other Findings Day of surgery medications reviewed with the patient.  Reproductive/Obstetrics negative OB ROS                            Anesthesia Physical Anesthesia Plan  ASA: III  Anesthesia Plan: General   Post-op Pain Management:    Induction: Intravenous  PONV Risk Score and Plan: 3 and Ondansetron and Dexamethasone  Airway Management Planned: LMA  Additional Equipment:   Intra-op Plan:   Post-operative Plan: Extubation in OR  Informed Consent: I have reviewed the patients History and Physical, chart, labs and discussed the procedure including the risks, benefits and alternatives for the proposed anesthesia with the patient or authorized representative who has indicated his/her understanding and acceptance.     Dental advisory given  Plan Discussed with: CRNA and Surgeon  Anesthesia Plan Comments:        Anesthesia Quick Evaluation

## 2019-12-27 NOTE — Anesthesia Procedure Notes (Signed)
Procedure Name: LMA Insertion Date/Time: 12/27/2019 5:39 PM Performed by: Adria Dill, CRNA Pre-anesthesia Checklist: Patient identified, Emergency Drugs available, Suction available and Patient being monitored Patient Re-evaluated:Patient Re-evaluated prior to induction Oxygen Delivery Method: Circle system utilized Preoxygenation: Pre-oxygenation with 100% oxygen Induction Type: IV induction Ventilation: Mask ventilation without difficulty LMA: LMA inserted LMA Size: 4.0 Number of attempts: 1 Placement Confirmation: positive ETCO2 and breath sounds checked- equal and bilateral Tube secured with: Tape Dental Injury: Teeth and Oropharynx as per pre-operative assessment

## 2019-12-27 NOTE — Progress Notes (Signed)
PROGRESS NOTE                                                                                                                                                                                                             Patient Demographics:    Timothy Ramsey, is a 54 y.o. male, DOB - 1966-07-09, PJK:[id number]  Admit date - 12/25/2019   Admitting Physician Therisa Doyne, MD  Outpatient Primary MD for the patient is No primary care provider on file.  LOS - 2  Chief Complaint  Patient presents with  . Hand Infection / Cellulitis       Brief Narrative  Timothy Ramsey is a 54 y.o. male with medical history significant of HTN, hep C past hx of IV drug use, prior hand cellulitis in 2019, who presents with right hand pain and swelling diagnosed with cellulitis outpatient after he got his right hand injured moving furniture to furniture 1 week ago he says it could have been a splinter or a bug bite, he got Bactrim outpatient however his hand kept on getting worse so he came to the hospital where he was diagnosed with right hand cellulitis and admitted for further treatment.  Hand surgery was consulted.   Subjective:   Patient in bed, denies any headache chest or abdominal pain, right arm discomfort better.  No nausea or shortness of breath.   Assessment  & Plan :     1.  Right hand cellulitis.  Failed outpatient Bactrim treatment, question if this is a spider bite.  For now keep the extremity elevated, agree with IV antibiotics, follow cultures and procalcitonin, have consulted Dr. Orlan Leavens hand surgeon who will see the patient shortly.  Will defer further management to hand surgery.  2.  History of treated hep C.  Mildly elevated LFTs.  Asymptomatic, trend is stable, right upper quadrant ultrasound nonacute with nonspecific findings, stable synthetic function with INR of 1, requested to follow with his GI physician post discharge within a week.  3.  Essential hypertension.  On  Norvasc, added hydralazine for better control.  4.  Depression.  On Wellbutrin.  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  Hepatic Function Panel     Component Value Date/Time   PROT 6.6 12/27/2019 0249   ALBUMIN 2.9 (L) 12/27/2019 0249   AST 196 (H) 12/27/2019 0249   ALT 430 (H) 12/27/2019 0249   ALKPHOS 81 12/27/2019 0249   BILITOT 0.8  12/27/2019 0249    Lab Results  Component Value Date   INR 1.0 12/26/2019     Family Communication  :   None  Code Status :  Full  Disposition Plan  : In the hospital for the treatment of severe right hand cellulitis.  Consults  : Hand surgeon Dr. Lamount Cohen  Procedures  :    Liver ultrasound. Common bile duct is mildly dilated proximally at 8 mm, but no intrahepatic biliary dilatation is noted. Distal portion cannot be visualized due to overlying bowel gas. Correlation with liver function tests is recommended to rule out biliary obstruction. No other abnormality seen in the right upper quadrant of the abdomen.  DVT Prophylaxis  :   Heparin    Lab Results  Component Value Date   PLT 197 12/27/2019    Diet :  Diet Order            Diet NPO time specified Except for: Sips with Meds  Diet effective 0500 tomorrow               Inpatient Medications Scheduled Meds: . amLODipine  10 mg Oral Daily  . buPROPion  150 mg Oral Daily  . Chlorhexidine Gluconate Cloth  6 each Topical Q0600  . feeding supplement (PRO-STAT SUGAR FREE 64)  30 mL Oral TID WC  . heparin injection (subcutaneous)  5,000 Units Subcutaneous Q8H  . hydrALAZINE  50 mg Oral Q8H  . mirtazapine  15 mg Oral QHS  . mupirocin ointment  1 application Nasal BID  . tobramycin   Left Eye BID   Continuous Infusions: . vancomycin 1,500 mg (12/27/19 0007)   PRN Meds:.[DISCONTINUED] ondansetron **OR** ondansetron (ZOFRAN) IV, traMADol  Antibiotics  :   Anti-infectives (From admission, onward)   Start     Dose/Rate Route Frequency Ordered Stop    12/26/19 1200  vancomycin (VANCOREADY) IVPB 1500 mg/300 mL     1,500 mg 150 mL/hr over 120 Minutes Intravenous Every 12 hours 12/25/19 2325     12/25/19 2245  vancomycin (VANCOREADY) IVPB 2000 mg/400 mL     2,000 mg 200 mL/hr over 120 Minutes Intravenous  Once 12/25/19 2241 12/26/19 0204          Objective:   Vitals:   12/26/19 0930 12/26/19 1500 12/26/19 1947 12/27/19 0302  BP: (!) 154/100  (!) 143/99 (!) 155/90  Pulse: 78 86 85 62  Resp: 18  18 18   Temp: 98.1 F (36.7 C)  98.3 F (36.8 C) 98.2 F (36.8 C)  TempSrc: Oral  Oral Oral  SpO2: 97% 99% 97% 96%  Weight:      Height:        SpO2: 96 %  Wt Readings from Last 3 Encounters:  12/25/19 100 kg     Intake/Output Summary (Last 24 hours) at 12/27/2019 1040 Last data filed at 12/27/2019 0900 Gross per 24 hour  Intake 2545.97 ml  Output 900 ml  Net 1645.97 ml     Physical Exam  Awake Alert, No new F.N deficits, Normal affect [place].AT,PERRAL Supple Neck,No JVD, No cervical lymphadenopathy appriciated.  Symmetrical Chest wall movement, Good air movement bilaterally, CTAB RRR,No Gallops, Rubs or new Murmurs, No Parasternal Heave +ve B.Sounds, Abd Soft, No tenderness, No organomegaly appriciated, No rebound - guarding or rigidity. R hand is swollen warm n red under bandage, no palpable axillary or antecubital lymph nodes,    picture from admission 12/25/19 below        Data Review:  Recent Labs  Lab 12/25/19 2026 12/26/19 0353 12/27/19 0249  WBC 13.5* 11.9* 9.1  HGB 17.1* 15.9 16.8  HCT 50.2 46.3 50.8  PLT 208 187 197  MCV 93.1 93.0 94.8  MCH 31.7 31.9 31.3  MCHC 34.1 34.3 33.1  RDW 12.4 12.6 12.9  LYMPHSABS 2.7  --  3.0  MONOABS 1.5*  --  1.0  EOSABS 0.1  --  0.2  BASOSABS 0.1  --  0.0    Recent Labs  Lab 12/25/19 2026 12/26/19 0353 12/27/19 0249  NA 136 138 139  K 3.7 3.9 4.1  CL 100 100 105  CO2 24 27 24   GLUCOSE 113* 148* 117*  BUN 11 13 11   CREATININE 1.03 1.11 0.81    CALCIUM 8.8* 8.9 9.0  AST 193* 171* 196*  ALT 393* 359* 430*  ALKPHOS 89 79 81  BILITOT 0.5 1.3* 0.8  ALBUMIN 3.6 3.1* 2.9*  MG  --  1.9 1.9  CRP  --  2.9* 2.7*  PROCALCITON  --  <0.10 <0.10  INR  --  1.0  --   TSH  --  1.978  --     Recent Labs  Lab 12/26/19 0111 12/26/19 0353 12/27/19 0249  CRP  --  2.9* 2.7*  PROCALCITON  --  <0.10 <0.10  SARSCOV2NAA NEGATIVE  --   --     ------------------------------------------------------------------------------------------------------------------ Recent Labs    12/26/19 0353  CHOL 141  HDL 73  LDLCALC 58  TRIG 49  CHOLHDL 1.9    No results found for: HGBA1C ------------------------------------------------------------------------------------------------------------------ Recent Labs    12/26/19 0353  TSH 1.978   ------------------------------------------------------------------------------------------------------------------ No results for input(s): VITAMINB12, FOLATE, FERRITIN, TIBC, IRON, RETICCTPCT in the last 72 hours.  Coagulation profile Recent Labs  Lab 12/26/19 0353  INR 1.0    No results for input(s): DDIMER in the last 72 hours.  Cardiac Enzymes No results for input(s): CKMB, TROPONINI, MYOGLOBIN in the last 168 hours.  Invalid input(s): CK ------------------------------------------------------------------------------------------------------------------ No results found for: BNP  Micro Results Recent Results (from the past 240 hour(s))  Blood culture (routine x 2)     Status: None (Preliminary result)   Collection Time: 12/25/19  8:27 PM   Specimen: BLOOD LEFT ARM  Result Value Ref Range Status   Specimen Description BLOOD LEFT ARM  Final   Special Requests   Final    BOTTLES DRAWN AEROBIC AND ANAEROBIC Blood Culture adequate volume   Culture   Final    NO GROWTH < 12 HOURS Performed at [facility], [address], [place], [address] [place]    Report Status PENDING  Incomplete  Blood  culture (routine x 2)     Status: None (Preliminary result)   Collection Time: 12/25/19  8:28 PM   Specimen: BLOOD LEFT HAND  Result Value Ref Range Status   Specimen Description BLOOD LEFT HAND  Final   Special Requests   Final    BOTTLES DRAWN AEROBIC ONLY Blood Culture adequate volume   Culture   Final    NO GROWTH < 12 HOURS Performed at [facility], [address] [place], [address] [place]    Report Status PENDING  Incomplete  SARS CORONAVIRUS 2 (TAT 6-24 HRS) Nasopharyngeal Nasopharyngeal Swab     Status: None   Collection Time: 12/26/19  1:11 AM   Specimen: Nasopharyngeal Swab  Result Value Ref Range Status   SARS Coronavirus 2 NEGATIVE NEGATIVE Final    Comment: (NOTE) SARS-CoV-2 target  nucleic acids are NOT DETECTED. The SARS-CoV-2 RNA is generally detectable in upper and lower respiratory specimens during the acute phase of infection. Negative results do not preclude SARS-CoV-2 infection, do not rule out co-infections with other pathogens, and should not be used as the sole basis for treatment or other patient management decisions. Negative results must be combined with clinical observations, patient history, and epidemiological information. The expected result is Negative. Fact Sheet for Patients: HairSlick.no Fact Sheet for Healthcare Providers: quierodirigir.com This test is not yet approved or cleared by the [place] FDA and  has been authorized for detection and/or diagnosis of SARS-CoV-2 by FDA under an Emergency Use Authorization (EUA). This EUA will remain  in effect (meaning this test can be used) for the duration of the COVID-19 declaration under Section 56 4(b)(1) of the Act, 21 U.S.C. section 360bbb-3(b)(1), unless the authorization is terminated or revoked sooner. Performed at [facility], [address] [place], [place] [address]   Surgical pcr screen     Status: Abnormal    Collection Time: 12/26/19  2:22 AM   Specimen: Nasal Mucosa; Nasal Swab  Result Value Ref Range Status   MRSA, PCR POSITIVE (A) NEGATIVE Final    Comment: RESULT CALLED TO, READ BACK BY AND VERIFIED WITH: VANDYKE,S RN [phone] 12/26/2019 MITCHELL,L    Staphylococcus aureus POSITIVE (A) NEGATIVE Final    Comment: (NOTE) The Xpert SA Assay (FDA approved for NASAL specimens in patients 25 years of age and older), is one component of a comprehensive surveillance program. It is not intended to diagnose infection nor to guide or monitor treatment.     Radiology Reports DG Hand Complete Right  Result Date: 12/25/2019 CLINICAL DATA:  Swelling EXAM: RIGHT HAND - COMPLETE 3+ VIEW COMPARISON:  December 25, 2019 FINDINGS: There is extensive soft tissue swelling about the hand. There is no acute displaced fracture or dislocation. There is no radiopaque foreign body or subcutaneous gas. There is no radiographic evidence for osteomyelitis. IMPRESSION: No acute osseous abnormality. Extensive soft tissue swelling about the hand. Electronically Signed   By: Katherine Mantle M.D.   On: 12/25/2019 21:26   US Abdomen Limited RUQ  Result Date: 12/26/2019 CLINICAL DATA:  Elevated liver function tests. EXAM: ULTRASOUND ABDOMEN LIMITED RIGHT UPPER QUADRANT COMPARISON:  June 16, 2018. FINDINGS: Gallbladder: No gallstones or wall thickening visualized. No sonographic Murphy sign noted by sonographer. Common bile duct: Diameter: 8 mm proximal which is mildly dilated, but no intrahepatic biliary dilatation is noted. Distal portion could not be visualized due to overlying bowel gas. Liver: No focal lesion identified. Within normal limits in parenchymal echogenicity. Portal vein is patent on color Doppler imaging with normal direction of blood flow towards the liver. Other: None. IMPRESSION: Common bile duct is mildly dilated proximally at 8 mm, but no intrahepatic biliary dilatation is noted. Distal portion cannot be  visualized due to overlying bowel gas. Correlation with liver function tests is recommended to rule out biliary obstruction. No other abnormality seen in the right upper quadrant of the abdomen. Electronically Signed   By: Lupita Raider M.D.   On: 12/26/2019 08:37    Time Spent in minutes  30   Susa Raring M.D on 12/27/2019 at 10:40 AM  To page go to [email or web address] - password [facility]

## 2019-12-27 NOTE — Progress Notes (Signed)
The patient was seen and evaluated today.  The patient's wound does not look better.  He has increased purulence.  Is my recommendation that the patient undergo formal incision and drainage of the abscess region.  Patient voiced understanding. The site was marked. Consent signed. Patient will be admitted back to the internal medicine service following surgery.

## 2019-12-27 NOTE — Plan of Care (Signed)
  Problem: Health Behavior/Discharge Planning: Goal: Ability to manage health-related needs will improve Outcome: Progressing   Problem: Pain Managment: Goal: General experience of comfort will improve Outcome: Progressing   

## 2019-12-27 NOTE — TOC Initial Note (Signed)
Transition of Care [facility]) - Initial/Assessment Note    Patient Details  Name: Timothy Ramsey MRN: [id number] Date of Birth: 1965-12-12  Transition of [facility]) CM/SW Contact:    Curlene Labrum, RN Phone Number: 12/27/2019, 12:44 PM  Clinical Narrative:                   Consepcion Ramsey a 54 y.o.malewith medical history significant of HTN, hep C past hx of IV drug use, prior hand cellulitis in 2019, who presents with right hand pain and swelling diagnosed with cellulitis outpatient after he got his right hand injured moving furniture to furniture 1 week ago he says it could have been a splinter or a bug bite, he got Bactrim outpatient however his hand kept on getting worse so he came to the hospital where he was diagnosed with right hand cellulitis and admitted for further treatment.  Hand surgery was consulted.  Patient plans for Right hand surgery with Caralyn Guile this afternoon.  Patient's primary nurse asking for [facility] consult for Primary Physician help for [facility] area.  Case management met with the patient at the bedside and patient was given information for [facility] and the number to set up primary care.  Patient also given physician office's accepting new patient's in [facility].  Patient also uses [facility] in [place].  Patient drives and family lives in [place], [place] to transportation home once discharged from the hospital after surgery.  No other barriers to discharge noted at this time - pending surgery on right hand.   Expected Discharge Plan: Home/Self Care Barriers to Discharge: Barriers Resolved(Patient given information for Primary Care Physician options in [facility].)   Patient Goals and CMS Choice Patient states their goals for this hospitalization and ongoing recovery are:: I'm having surgery today with Dr. Caralyn Guile to have my right hand surgery.      Expected Discharge Plan and Services Expected Discharge Plan: Home/Self Care   Discharge Planning  Services: CM Consult   Living arrangements for the past 2 months: Single Family Home                                      Prior Living Arrangements/Services Living arrangements for the past 2 months: Single Family Home Lives with:: Self Patient language and need for interpreter reviewed:: Yes Do you feel safe going back to the place where you live?: Yes      Need for Family Participation in Patient Care: Yes (Comment) Care giver support system in place?: Yes (comment)(Patient lives alone but parents live in [place], [place] as well.)   Criminal Activity/Legal Involvement Pertinent to Current Situation/Hospitalization: No - Comment as needed  Activities of Daily Living Home Assistive Devices/Equipment: Contact lenses, Eyeglasses ADL Screening (condition at time of admission) Patient's cognitive ability adequate to safely complete daily activities?: Yes Is the patient deaf or have difficulty Ramsey?: No Does the patient have difficulty seeing, even when wearing glasses/contacts?: No Does the patient have difficulty concentrating, remembering, or making decisions?: No Patient able to express need for assistance with ADLs?: No Does the patient have difficulty dressing or bathing?: No Independently performs ADLs?: Yes (appropriate for developmental age) Does the patient have difficulty walking or climbing stairs?: No Weakness of Legs: Left Weakness of Arms/Hands: Right  Permission Sought/Granted Permission sought to share information with : Case Manager  Emotional Assessment Appearance:: Appears stated age Attitude/Demeanor/Rapport: Gracious Affect (typically observed): Accepting Orientation: : Oriented to Self, Oriented to Place, Oriented to  Time, Oriented to Situation Alcohol / Substance Use: Not Applicable Psych Involvement: No (comment)  Admission diagnosis:  Cellulitis [L03.90] Elevated LFTs [R79.89] Cellulitis of right upper extremity  [L03.113] Patient Active Problem List   Diagnosis Date Noted  . Elevated LFTs 12/26/2019  . Cellulitis of right hand 12/25/2019  . History of hepatitis C 12/25/2019  . Essential hypertension 12/25/2019  . Cellulitis 12/25/2019  . Generalized anxiety disorder 09/18/2016  . 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 Pharmacy:   [facility], [place] [address] Phone: [phone] Fax: [phone]     Social Determinants of Health (SDOH) Interventions    Readmission Risk Interventions No flowsheet data found.

## 2019-12-27 NOTE — Op Note (Signed)
PREOPERATIVE DIAGNOSIS: Right wrist dorsal abscess and first dorsal compartment tenosynovitis  POSTOPERATIVE DIAGNOSIS: Same  ATTENDING SURGEON: Dr. Bradly Bienenstock who scrubbed and present for the entire procedure  ASSISTANT SURGEON: None  ANESTHESIA: General via LMA  OPERATIVE PROCEDURE:Incision and drainage of right wrist dorsal abscess Right wrist first dorsal compartment extensor tenosynovectomy  IMPLANTS: None  RADIOGRAPHIC INTERPRETATION: None  SURGICAL INDICATIONS: Patient is a right-hand-dominant gentleman with a worsening infection over his right first dorsal compartment and wrist region.  Patient elected undergo the above procedure.  Risks of surgery include but not limited to bleeding infection damage nearby nerves arteries or tendons loss of motion of the wrist and digits incomplete relief of symptoms and need for further surgical intervention.  SURGICAL TECHNIQUE: Patient was palpated via the preoperative holding area marked the permanent marker made on the right wrist indicate the correct operative site.  Patient brought back operating placed supine on the anesthesia table where the general anesthetic was administered.  Patient had been on preoperative antibiotics.  Well-padded tourniquet was then placed on the right brachium seal with the appropriate drape.  The right upper extremity then prepped and draped in sterile fashion.  A timeout was called the correct site was identified the procedure then begun.  Attention was then turned to the right wrist.  A longitudinal incision made directly over the abscess region over the first dorsal compartment at the level of the anatomical snuffbox.  The tourniquet of been insufflated.  Dissection carried down through the skin and subcutaneous tissue over the deep abscess was then encountered around the region of the first dorsal compartment.  The first dorsal compartment was opened up distally.  Extensor tenosynovectomy was then carried out of  the EPB as well as multiple slips of the APL.  The wound was then thoroughly irrigated the abscess region tendons were then thoroughly debrided and irrigated.  Wound cultures were taken.  Following thorough wound irrigation the wound was then closed with simple Prolene sutures and packed with sterile packing gauze.  Adaptic dressing a sterile compressive bandage then applied.  The patient was then placed in a well-padded thumb spica splint extubated taken recovery in good condition.  POSTOPERATIVE PLAN: Patient be admitted back to the internal medicine service.  Follow his wound cultures.  Is okay for the patient to go home from orthopedic standpoint tomorrow.  He will need close follow-up in the office on Thursday for wound check and packing change.  We will begin an outpatient therapy regimen and wound care regimen.  Oral antibiotics as per the primary service.

## 2019-12-27 NOTE — Evaluation (Signed)
Occupational Therapy Evaluation Patient Details Name: Timothy Ramsey MRN: [id number] DOB: 07-12-1966 Today's Date: 12/27/2019    History of Present Illness Pt is a 54 y/o male admitted with R hand infection and cellulitis. He is left-hand dominant.   Clinical Impression   Patient evaluated by Occupational Therapy with no further acute OT needs identified. Pt demonstrates ability to complete ADL at modified independent to independent level. Pt working on computer upon OT arrival. Noted pt did not have RUE elevated and noted edema in R hand, hand warm to touch and pink in color. Dressing in place. Educated pt on importance of elevating RUE and mobilizing digits. Pt with decreased fine motor coordination due to increased edema. Pt with potential surgery for I&D of Rhand infection. Please re-order therapy should pt have change in status. If pt does not have surgery, pt appropriate to d/c when medically stable with follow-up therapies as recommended by surgeon, pt may benefit from hand therapy. All education complete, pt verbalized understanding, pt with no additional acute needs identified at this time. OT will sign off at this time. Thank you for referral.    Follow Up Recommendations  Follow surgeon's recommendation for DC plan and follow-up therapies Pt may benefit from hand therapy.   Equipment Recommendations  None recommended by OT    Recommendations for Other Services       Precautions / Restrictions Precautions Precautions: None Restrictions Weight Bearing Restrictions: No      Mobility Bed Mobility               General bed mobility comments: pt sitting in chair upon arrival  Transfers Overall transfer level: Independent Equipment used: None             General transfer comment: pt able to manage the IV pole     Balance Overall balance assessment: Independent                                         ADL either performed or assessed with  clinical judgement   ADL Overall ADL's : Modified independent                                       General ADL Comments: pt able to complete ADL at modified independent level;pt working on lap top upon arrival using mouse in RUE and typing on keyboard, able to open containers with increased time and effort     Vision Baseline Vision/History: Wears glasses Wears Glasses: Reading only Patient Visual Report: No change from baseline Vision Assessment?: No apparent visual deficits     Perception     Praxis      Pertinent Vitals/Pain Pain Assessment: 0-10 Pain Score: 3  Pain Location: right hand Pain Descriptors / Indicators: Sore Pain Intervention(s): Limited activity within patient's tolerance;Monitored during session;Repositioned     Hand Dominance Left   Extremity/Trunk Assessment Upper Extremity Assessment Upper Extremity Assessment: RUE deficits/detail RUE Deficits / Details: noted increased edema in hand, slightly warm to the touch and pink in color, pt reports redness has decreased and temperature has been consistent;pt with decreased fine motor coordination due to edema but able to use functionally RUE Sensation: WNL RUE Coordination: decreased fine motor   Lower Extremity Assessment Lower Extremity Assessment: Overall WFL for tasks assessed  Cervical / Trunk Assessment Cervical / Trunk Assessment: Normal   Communication Communication Communication: No difficulties   Cognition Arousal/Alertness: Awake/alert Behavior During Therapy: WFL for tasks assessed/performed Overall Cognitive Status: Within Functional Limits for tasks assessed                                     General Comments  Rhand increased edema, skin pink and warm to touch    Exercises Exercises: Other exercises Other Exercises Other Exercises: educated pt on importance of mobiltiy of digits Other Exercises: educated pt on importance of keeping RUE elevated    Shoulder Instructions      Home Living Family/patient expects to be discharged to:: Private residence Living Arrangements: Alone   Type of Home: House Home Access: Stairs to enter Secretary/administrator of Steps: 4-5 Entrance Stairs-Rails: Right;Left Home Layout: One level                          Prior Functioning/Environment Level of Independence: Independent        Comments: Runs lifts weights daily; enjoys hiking;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 [employer], has to travel and put together/take down show rooms occassionally        OT Problem List: Decreased range of motion;Pain;Impaired UE functional use      OT Treatment/Interventions:      OT Goals(Current goals can be found in the care plan section) Acute Rehab OT Goals Patient Stated Goal: to be able to run again soon OT Goal Formulation: With patient Time For Goal Achievement: 01/10/20 Potential to Achieve Goals: Good  OT Frequency:     Barriers to D/C:            Co-evaluation              AM-PAC OT "6 Clicks" Daily Activity     Outcome Measure Help from another person eating meals?: None Help from another person taking care of personal grooming?: None Help from another person toileting, which includes using toliet, bedpan, or urinal?: None Help from another person bathing (including washing, rinsing, drying)?: None Help from another person to put on and taking off regular upper body clothing?: None Help from another person to put on and taking off regular lower body clothing?: None 6 Click Score: 24   End of Session Nurse Communication: Mobility status  Activity Tolerance: Patient tolerated treatment well Patient left: in chair;with call bell/phone within reach  OT Visit Diagnosis: Pain;Other abnormalities of gait and mobility (R26.89) Pain - Right/Left: Right Pain - part of body: Hand                Time: 2023-3435 OT Time Calculation (min): 13 min Charges:  OT General  Charges $OT Visit: 1 Visit OT Evaluation $OT Eval Low Complexity: 1 Low  Beatryce Colombo OTR/L Acute Rehabilitation Services Office: [phone]   Rebeca Alert 12/27/2019, 12:14 PM

## 2019-12-27 NOTE — Transfer of Care (Signed)
Immediate Anesthesia Transfer of Care Note  Patient: Timothy Ramsey  Procedure(s) Performed: IRRIGATION AND DEBRIDEMENT RIGHT WRIST (Right )  Patient Location: PACU  Anesthesia Type:General  Level of Consciousness: drowsy and patient cooperative  Airway & Oxygen Therapy: Patient Spontanous Breathing and Patient connected to nasal cannula oxygen  Post-op Assessment: Report given to RN and Post -op Vital signs reviewed and stable  Post vital signs: Reviewed and stable  Last Vitals:  Vitals Value Taken Time  BP    Temp 36.8 C 12/27/19 1820  Pulse 81 12/27/19 1822  Resp 21 12/27/19 1822  SpO2 100 % 12/27/19 1822  Vitals shown include unvalidated device data.  Last Pain:  Vitals:   12/27/19 1820  TempSrc:   PainSc: (P) Asleep      Patients Stated Pain Goal: 0 (12/26/19 2118)  Complications: No apparent anesthesia complications

## 2019-12-28 LAB — CBC WITH DIFFERENTIAL/PLATELET
Abs Immature Granulocytes: 0.05 10*3/uL (ref 0.00–0.07)
Basophils Absolute: 0 10*3/uL (ref 0.0–0.1)
Basophils Relative: 0 %
Eosinophils Absolute: 0 10*3/uL (ref 0.0–0.5)
Eosinophils Relative: 0 %
HCT: 47.8 % (ref 39.0–52.0)
Hemoglobin: 16.1 g/dL (ref 13.0–17.0)
Immature Granulocytes: 1 %
Lymphocytes Relative: 14 %
Lymphs Abs: 1.2 10*3/uL (ref 0.7–4.0)
MCH: 31.4 pg (ref 26.0–34.0)
MCHC: 33.7 g/dL (ref 30.0–36.0)
MCV: 93.2 fL (ref 80.0–100.0)
Monocytes Absolute: 0.4 10*3/uL (ref 0.1–1.0)
Monocytes Relative: 5 %
Neutro Abs: 7.1 10*3/uL (ref 1.7–7.7)
Neutrophils Relative %: 80 %
Platelets: 199 10*3/uL (ref 150–400)
RBC: 5.13 MIL/uL (ref 4.22–5.81)
RDW: 12.6 % (ref 11.5–15.5)
WBC: 8.8 10*3/uL (ref 4.0–10.5)
nRBC: 0 % (ref 0.0–0.2)

## 2019-12-28 LAB — COMPREHENSIVE METABOLIC PANEL
ALT: 456 U/L — ABNORMAL HIGH (ref 0–44)
AST: 188 U/L — ABNORMAL HIGH (ref 15–41)
Albumin: 2.8 g/dL — ABNORMAL LOW (ref 3.5–5.0)
Alkaline Phosphatase: 76 U/L (ref 38–126)
Anion gap: 9 (ref 5–15)
BUN: 21 mg/dL — ABNORMAL HIGH (ref 6–20)
CO2: 22 mmol/L (ref 22–32)
Calcium: 9.2 mg/dL (ref 8.9–10.3)
Chloride: 105 mmol/L (ref 98–111)
Creatinine, Ser: 1.26 mg/dL — ABNORMAL HIGH (ref 0.61–1.24)
GFR calc Af Amer: >60 mL/min (ref >60)
GFR calc non Af Amer: >60 mL/min (ref >60)
Glucose, Bld: 142 mg/dL — ABNORMAL HIGH (ref 70–99)
Potassium: 4.8 mmol/L (ref 3.5–5.1)
Sodium: 136 mmol/L (ref 135–145)
Total Bilirubin: 0.9 mg/dL (ref 0.3–1.2)
Total Protein: 6.4 g/dL — ABNORMAL LOW (ref 6.5–8.1)

## 2019-12-28 LAB — MAGNESIUM: Magnesium: 1.8 mg/dL (ref 1.7–2.4)

## 2019-12-28 LAB — C-REACTIVE PROTEIN: CRP: 0.9 mg/dL (ref <1.0)

## 2019-12-28 LAB — PROCALCITONIN: Procalcitonin: <0.1 ng/mL

## 2019-12-28 MED ORDER — VANCOMYCIN HCL 1250 MG/250ML IV SOLN
1250.0000 mg | Freq: Two times a day (BID) | INTRAVENOUS | Status: DC
  Filled 2019-12-28: qty 250

## 2019-12-28 MED ORDER — DOXYCYCLINE HYCLATE 100 MG PO CAPS: 100.0000 mg | ORAL_CAPSULE | Freq: Two times a day (BID) | ORAL | 0 refills | Status: AC

## 2019-12-28 MED ORDER — LACTATED RINGERS IV SOLN: INTRAVENOUS | Status: AC

## 2019-12-28 NOTE — Discharge Summary (Signed)
Timothy Ramsey [id number] DOB: [birth year] DOA: 12/25/2019  PCP: No primary care provider on file.  Admit date: 12/25/2019  Discharge date: 12/28/2019  Admitted From: Home   Disposition:  Home   Recommendations for Outpatient Follow-up:   Follow up with PCP in 1-2 weeks  PCP Please obtain BMP/CBC, 2 view CXR in 1week,  (see Discharge instructions)   PCP Please follow up on the following pending results:    Home Health: None   Equipment/Devices: None  Consultations: Hand Surgery - Dr Caralyn Guile Discharge Condition: Stable    CODE STATUS: Full    Diet Recommendation: Heart Healthy   Diet Order            Diet - low sodium heart healthy        Diet regular Room service appropriate? Yes; Fluid consistency: Thin  Diet effective now               Chief Complaint  Patient presents with  . Hand Infection / Cellulitis     Brief history of present illness from the day of admission and additional interim summary    Timothy F Thomasis a 53 y.o.malewith medical history significant of HTN, hep C past hx of IV drug use, prior hand cellulitis in 2019, who presents with right hand pain and swelling diagnosed with cellulitis outpatient after he got his right hand injured moving furniture to furniture 1 week ago he says it could have been a splinter or a bug bite, he got Bactrim outpatient however his hand kept on getting worse so he came to the hospital where he was diagnosed with right hand cellulitis and admitted for further treatment.  Hand surgery was consulted.                                                                 Hospital Course   1.  Right hand cellulitis/abscess.  Failed outpatient Bactrim treatment, question if this is a spider bite.  Failed outpatient Bactrim treatment, seen by hand surgeon Dr.  Henderson [place] underwent surgical drainage on 12/27/2019, was given IV antibiotics as well including IV vancomycin as his nasal swab was positive for MRSA is from the wrist abscess are growing staph aureus as well, procalcitonin negative, WBC has stabilized, cleared from hand surgeon for home discharge, will be discharged home on 10 more days of oral doxycycline with follow-up with hand surgery in 2 days.  Request hand surgeon to follow final culture and sensitivity results from the abscess.  2.  History of treated hep C.  Mildly elevated LFTs.  Asymptomatic, trend is stable, right upper quadrant ultrasound nonacute with nonspecific findings, stable synthetic function with INR of 1, requested to follow with his GI physician post discharge within a week, patient is symptom-free will follow with his GI physician  in a week.  Hepatic Function Latest Ref Rng & Units 12/28/2019 12/27/2019 12/26/2019  Total Protein 6.5 - 8.1 g/dL 6.4(L) 6.6 6.8  Albumin 3.5 - 5.0 g/dL 2.8(L) 2.9(L) 3.1(L)  AST 15 - 41 U/L 188(H) 196(H) 171(H)  ALT 0 - 44 U/L 456(H) 430(H) 359(H)  Alk Phosphatase 38 - 126 U/L 76 81 79  Total Bilirubin 0.3 - 1.2 mg/dL 0.9 0.8 1.3(H)    Lab Results  Component Value Date   INR 1.0 12/26/2019    3.  Essential hypertension.    Continue home regimen and follow with PCP.  4.  Depression.  On Wellbutrin.  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     Discharge diagnosis     Active Problems:   Cellulitis of right hand   History of hepatitis C   Essential hypertension   Cellulitis   Elevated LFTs    Discharge instructions    Discharge Instructions    Diet - low sodium heart healthy   Complete by: As directed    Discharge instructions   Complete by: As directed    Keep your right hand clean and dry at all times, keep it elevated under 3 pillows at night.  Follow with Dr. Apolonio Schneiders coming Thursday in his office for dressing  change.  Follow with Primary MD  in 7 days   Get CBC, CMP checked next visit within 1 week by Primary MD   Activity: As tolerated with Full fall precautions use walker/cane & assistance as needed  Disposition Home    Diet: Heart Healthy    Special Instructions: If you have smoked or chewed Tobacco  in the last 2 yrs please stop smoking, stop any regular Alcohol  and or any Recreational drug use.  On your next visit with your primary care physician please Get Medicines reviewed and adjusted.  Please request your Prim.MD to go over all Hospital Tests and Procedure/Radiological results at the follow up, please get all Hospital records sent to your Prim MD by signing hospital release before you go home.  If you experience worsening of your admission symptoms, develop shortness of breath, life threatening emergency, suicidal or homicidal thoughts you must seek medical attention immediately by calling 911 or calling your MD immediately  if symptoms less severe.  You Must read complete instructions/literature along with all the possible adverse reactions/side effects for all the Medicines you take and that have been prescribed to you. Take any new Medicines after you have completely understood and accpet all the possible adverse reactions/side effects.   Increase activity slowly   Complete by: As directed       Discharge Medications   Allergies as of 12/28/2019      Reactions   Codeine    Hydrocodone-acetaminophen Other (See Comments)   Drowsiness   Penicillin G       Medication List    STOP taking these medications   sulfamethoxazole-trimethoprim 800-160 MG tablet Commonly known as: BACTRIM DS     TAKE these medications   amLODipine 10 MG tablet Commonly known as: NORVASC Take 10 mg by mouth daily.   buPROPion 300 MG 24 hr tablet Commonly known as: WELLBUTRIN XL Take 1 tablet (300 mg total) by mouth daily. What changed: additional instructions   buPROPion 150 MG 24 hr  tablet Commonly known as: Wellbutrin XL Take with 300 for a total of 450 What changed:   how  much to take  how to take this  when to take this  additional instructions   doxycycline 100 MG capsule Commonly known as: VIBRAMYCIN Take 1 capsule (100 mg total) by mouth 2 (two) times daily.   gentamicin 0.3 % ophthalmic solution Commonly known as: GARAMYCIN Place 1 drop into the left eye every 6 (six) hours.   hydrochlorothiazide 25 MG tablet Commonly known as: HYDRODIURIL Take 25 mg by mouth daily.   mirtazapine 15 MG tablet Commonly known as: REMERON Take 15 mg by mouth at bedtime.       Follow-up Information    [facility] Call.   Why: Please call [facility] at the listed number above to facilitate connection with a Primary Care Physician in [facility] that are accepting new patient's with your insurance. Contact information: [phone]       Iran Planas, MD. Schedule an appointment as soon as possible for a visit in 2 day(s).   Specialty: Orthopedic Surgery Contact information: [address] [place] [address] [phone]        [facility]. Schedule an appointment as soon as possible for a visit in 1 week(s).   Specialty: Internal Medicine Contact information: [address] [id number] [place] [phone]          Major procedures and Radiology Reports - PLEASE review detailed and final reports thoroughly  -       DG Hand Complete Right  Result Date: 12/25/2019 CLINICAL DATA:  Swelling EXAM: RIGHT HAND - COMPLETE 3+ VIEW COMPARISON:  December 25, 2019 FINDINGS: There is extensive soft tissue swelling about the hand. There is no acute displaced fracture or dislocation. There is no radiopaque foreign body or subcutaneous gas. There is no radiographic evidence for osteomyelitis. IMPRESSION: No acute osseous abnormality. Extensive soft tissue swelling about the  hand. Electronically Signed   By: Constance Holster M.D.   On: 12/25/2019 21:26   US Abdomen Limited RUQ  Result Date: 12/26/2019 CLINICAL DATA:  Elevated liver function tests. EXAM: ULTRASOUND ABDOMEN LIMITED RIGHT UPPER QUADRANT COMPARISON:  June 16, 2018. FINDINGS: Gallbladder: No gallstones or wall thickening visualized. No sonographic Murphy sign noted by sonographer. Common bile duct: Diameter: 8 mm proximal which is mildly dilated, but no intrahepatic biliary dilatation is noted. Distal portion could not be visualized due to overlying bowel gas. Liver: No focal lesion identified. Within normal limits in parenchymal echogenicity. Portal vein is patent on color Doppler imaging with normal direction of blood flow towards the liver. Other: None. IMPRESSION: Common bile duct is mildly dilated proximally at 8 mm, but no intrahepatic biliary dilatation is noted. Distal portion cannot be visualized due to overlying bowel gas. Correlation with liver function tests is recommended to rule out biliary obstruction. No other abnormality seen in the right upper quadrant of the abdomen. Electronically Signed   By: Marijo Conception M.D.   On: 12/26/2019 08:37    Micro Results     Recent Results (from the past 240 hour(s))  Blood culture (routine x 2)     Status: None (Preliminary result)   Collection Time: 12/25/19  8:27 PM   Specimen: BLOOD LEFT ARM  Result Value Ref Range Status   Specimen Description BLOOD LEFT ARM  Final   Special Requests   Final    BOTTLES DRAWN AEROBIC AND ANAEROBIC Blood Culture adequate volume   Culture NO GROWTH 3 DAYS  Final   Report Status PENDING  Incomplete  Blood culture (routine x 2)     Status: None (Preliminary result)   Collection Time: 12/25/19  8:28 PM   Specimen: BLOOD LEFT HAND  Result Value Ref Range Status   Specimen Description BLOOD LEFT HAND  Final   Special Requests   Final    BOTTLES DRAWN AEROBIC ONLY Blood Culture adequate volume   Culture NO  GROWTH 3 DAYS  Final   Report Status PENDING  Incomplete  SARS CORONAVIRUS 2 (TAT 6-24 HRS) Nasopharyngeal Nasopharyngeal Swab     Status: None   Collection Time: 12/26/19  1:11 AM   Specimen: Nasopharyngeal Swab  Result Value Ref Range Status   SARS Coronavirus 2 NEGATIVE NEGATIVE Final    Comment: (NOTE) SARS-CoV-2 target nucleic acids are NOT DETECTED. The SARS-CoV-2 RNA is generally detectable in upper and lower respiratory specimens during the acute phase of infection. Negative results do not preclude SARS-CoV-2 infection, do not rule out co-infections with other pathogens, and should not be used as the sole basis for treatment or other patient management decisions. Negative results must be combined with clinical observations, patient history, and epidemiological information. The expected result is Negative. Fact Sheet for Patients: SugarRoll.be Fact Sheet for Healthcare Providers: https://www.woods-mathews.com/ This test is not yet approved or cleared by the [place] FDA and  has been authorized for detection and/or diagnosis of SARS-CoV-2 by FDA under an Emergency Use Authorization (EUA). This EUA will remain  in effect (meaning this test can be used) for the duration of the COVID-19 declaration under Section 56 4(b)(1) of the Act, 21 U.S.C. section 360bbb-3(b)(1), unless the authorization is terminated or revoked sooner. Performed at [facility], [place] [address], [place], [place] [address]   Surgical pcr screen     Status: Abnormal   Collection Time: 12/26/19  2:22 AM   Specimen: Nasal Mucosa; Nasal Swab  Result Value Ref Range Status   MRSA, PCR POSITIVE (A) NEGATIVE Final    Comment: RESULT CALLED TO, READ BACK BY AND VERIFIED WITH: VANDYKE,S RN [phone] 12/26/2019 MITCHELL,L    Staphylococcus aureus POSITIVE (A) NEGATIVE Final    Comment: (NOTE) The Xpert SA Assay (FDA approved for NASAL specimens in patients 47 years  of age and older), is one component of a comprehensive surveillance program. It is not intended to diagnose infection nor to guide or monitor treatment.   Aerobic/Anaerobic Culture (surgical/deep wound)     Status: None (Preliminary result)   Collection Time: 12/27/19  6:02 PM   Specimen: Abscess  Result Value Ref Range Status   Specimen Description ABSCESS RIGHT WRIST  Final   Special Requests NONE  Final   Gram Stain   Final    MODERATE WBC PRESENT, PREDOMINANTLY PMN FEW GRAM POSITIVE COCCI Performed at [facility], [address], [place], [place] [address]    Culture FEW STAPHYLOCOCCUS AUREUS  Final   Report Status PENDING  Incomplete    Today   Subjective    Timothy Ramsey today has no headache,no chest abdominal pain,no new weakness tingling or numbness, feels much better wants to go home today.     Objective   Blood pressure (!) 128/94, pulse 82, temperature 98.8 F (37.1 C), temperature source Oral, resp. rate 18, height 6' (1.829 m), weight 100 kg, SpO2 100 %.   Intake/Output Summary (Last 24 hours) at 12/28/2019 0942 Last data filed at 12/27/2019 1823 Gross per 24 hour  Intake 1100 ml  Output 25 ml  Net 1075  ml    Exam Awake Alert, Oriented x 3, No new F.N deficits, Normal affect [place].AT,PERRAL Supple Neck,No JVD, No cervical lymphadenopathy appriciated.  Symmetrical Chest wall movement, Good air movement bilaterally, CTAB RRR,No Gallops,Rubs or new Murmurs, No Parasternal Heave +ve B.Sounds, Abd Soft, Non tender, No organomegaly appriciated, No rebound -guarding or rigidity. No Cyanosis, right hand under bandage,   Data Review   CBC w Diff:  Lab Results  Component Value Date   WBC 8.8 12/28/2019   HGB 16.1 12/28/2019   HCT 47.8 12/28/2019   PLT 199 12/28/2019   LYMPHOPCT 14 12/28/2019   MONOPCT 5 12/28/2019   EOSPCT 0 12/28/2019   BASOPCT 0 12/28/2019    CMP:  Lab Results  Component Value Date   NA 136 12/28/2019   K 4.8 12/28/2019   CL  105 12/28/2019   CO2 22 12/28/2019   BUN 21 (H) 12/28/2019   CREATININE 1.26 (H) 12/28/2019   PROT 6.4 (L) 12/28/2019   ALBUMIN 2.8 (L) 12/28/2019   BILITOT 0.9 12/28/2019   ALKPHOS 76 12/28/2019   AST 188 (H) 12/28/2019   ALT 456 (H) 12/28/2019  .   Total Time in preparing paper work, data evaluation and todays exam - 59 minutes  Lala Lund M.D on 12/28/2019 at 9:42 AM  [facility]   Office  [phone]

## 2019-12-28 NOTE — Discharge Instructions (Signed)
Keep your right hand clean and dry at all times, keep it elevated under 3 pillows at night.  Follow with Dr. Orlan Leavens coming Thursday in his office for dressing change.  Follow with Primary MD  in 7 days   Get CBC, CMP checked next visit within 1 week by Primary MD   Activity: As tolerated with Full fall precautions use walker/cane & assistance as needed  Disposition Home    Diet: Heart Healthy    Special Instructions: If you have smoked or chewed Tobacco  in the last 2 yrs please stop smoking, stop any regular Alcohol  and or any Recreational drug use.  On your next visit with your primary care physician please Get Medicines reviewed and adjusted.  Please request your Prim.MD to go over all Hospital Tests and Procedure/Radiological results at the follow up, please get all Hospital records sent to your Prim MD by signing hospital release before you go home.  If you experience worsening of your admission symptoms, develop shortness of breath, life threatening emergency, suicidal or homicidal thoughts you must seek medical attention immediately by calling 911 or calling your MD immediately  if symptoms less severe.  You Must read complete instructions/literature along with all the possible adverse reactions/side effects for all the Medicines you take and that have been prescribed to you. Take any new Medicines after you have completely understood and accpet all the possible adverse reactions/side effects.

## 2019-12-28 NOTE — Plan of Care (Signed)
  Problem: Clinical Measurements: Goal: Ability to maintain clinical measurements within normal limits will improve Outcome: Progressing   Problem: Pain Managment: Goal: General experience of comfort will improve Outcome: Progressing   

## 2019-12-30 LAB — CULTURE, BLOOD (ROUTINE X 2)
Culture: NO GROWTH
Culture: NO GROWTH
Special Requests: ADEQUATE
Special Requests: ADEQUATE

## 2020-01-01 LAB — AEROBIC/ANAEROBIC CULTURE W GRAM STAIN (SURGICAL/DEEP WOUND)

## 2020-03-22 ENCOUNTER — Telehealth: Payer: Self-pay | Admitting: Pharmacy Technician

## 2020-03-22 NOTE — Telephone Encounter (Signed)
[facility] Patient Product/process development scientist completed.    The patient is insured through [facility].  Mavyret is preferred and on formulary, Harvoni and Dorita Fray are not.  A prior authorization for Mavyret will need to be requested.  We will continue to follow to see if copay assistance is needed.  Netty Starring. Dimas Aguas CPhT Specialty Pharmacy Patient [facility] Phone: [phone] Fax:  [phone]

## 2020-03-23 ENCOUNTER — Encounter: Payer: Self-pay | Admitting: Family

## 2020-03-23 ENCOUNTER — Other Ambulatory Visit: Payer: Self-pay

## 2020-03-23 ENCOUNTER — Ambulatory Visit (INDEPENDENT_AMBULATORY_CARE_PROVIDER_SITE_OTHER): Payer: 59 | Admitting: Family

## 2020-03-23 VITALS — HR 66 | Temp 98.0°F | Ht 74.0 in | Wt 203.0 lb

## 2020-03-23 DIAGNOSIS — B182 Chronic viral hepatitis C: Secondary | ICD-10-CM

## 2020-03-23 NOTE — Patient Instructions (Addendum)
Nice to see you.  We will check your lab work today and let you know the results.  Once we have the results we will work on the medication approval.   Limit acetaminophen (Tylenol) usage to no more than 2 grams (2,000 mg) per day.  Avoid alcohol.  Do not share toothbrushes or razors.  Practice safe sex to protect against transmission as well as sexually transmitted disease.    Hepatitis C Hepatitis C is a viral infection of the liver. It can lead to scarring of the liver (cirrhosis), liver failure, or liver cancer. Hepatitis C may go undetected for months or years because people with the infection may not have symptoms, or they may have only mild symptoms. What are the causes? This condition is caused by the hepatitis C virus (HCV). The virus can spread from person to person (is contagious) through:  Blood.  Childbirth. A woman who has hepatitis C can pass it to her baby during birth.  Bodily fluids, such as breast milk, tears, semen, vaginal fluids, and saliva.  Blood transfusions or organ transplants done in the [place] before 1992.  What increases the risk? The following factors may make you more likely to develop this condition:  Having contact with unclean (contaminated) needles or syringes. This may result from: ? Acupuncture. ? Tattoing. ? Body piercing. ? Injecting drugs.  Having unprotected sex with someone who is infected.  Needing treatment to filter your blood (kidney dialysis).  Having HIV (human immunodeficiency virus) or AIDS (acquired immunodeficiency syndrome).  Working in a job that involves contact with blood or bodily fluids, such as health care.  What are the signs or symptoms? Symptoms of this condition include:  Fatigue.  Loss of appetite.  Nausea.  Vomiting.  Abdominal pain.  Dark yellow urine.  Yellowish skin and eyes (jaundice).  Itchy skin.  Clay-colored bowel movements.  Joint pain.  Bleeding and bruising  easily.  Fluid building up in your stomach (ascites).  In some cases, you may not have any symptoms. How is this diagnosed? This condition is diagnosed with:  Blood tests.  Other tests to check how well your liver is functioning. They may include: ? Magnetic resonance elastography (MRE). This imaging test uses MRIs and sound waves to measure liver stiffness. ? Transient elastography. This imaging test uses ultrasounds to measure liver stiffness. ? Liver biopsy. This test requires taking a small tissue sample from your liver to examine it under a microscope.  How is this treated? Your health care provider may perform noninvasive tests or a liver biopsy to help decide the best course of treatment. Treatment may include:  Antiviral medicines and other medicines.  Follow-up treatments every 6-12 months for infections or other liver conditions.  Receiving a donated liver (liver transplant).  Follow these instructions at home: Medicines  Take over-the-counter and prescription medicines only as told by your health care provider.  Take your antiviral medicine as told by your health care provider. Do not stop taking the antiviral even if you start to feel better.  Do not take any medicines unless approved by your health care provider, including over-the-counter medicines and birth control pills. Activity  Rest as needed.  Do not have sex unless approved by your health care provider.  Ask your health care provider when you may return to school or work. Eating and drinking  Eat a balanced diet with plenty of fruits and vegetables, whole grains, and lowfat (lean) meats or non-meat proteins (such as beans or  tofu).  Drink enough fluids to keep your urine clear or pale yellow.  Do not drink alcohol. General instructions  Do not share toothbrushes, nail clippers, or razors.  Wash your hands frequently with soap and water. If soap and water are not available, use hand  sanitizer.  Cover any cuts or open sores on your skin to prevent spreading the virus.  Keep all follow-up visits as told by your health care provider. This is important. You may need follow-up visits every 6-12 months. How is this prevented? There is no vaccine for hepatitis C. The only way to prevent the disease is to reduce the risk of exposure to the virus. Make sure you:  Wash your hands frequently with soap and water. If soap and water are not available, use hand sanitizer.  Do not share needles or syringes.  Practice safe sex and use condoms.  Avoid handling blood or bodily fluids without gloves or other protection.  Avoid getting tattoos or piercings in shops or other locations that are not clean.  Contact a health care provider if:  You have a fever.  You develop abdominal pain.  You pass dark urine.  You pass clay-colored stools.  You develop joint pain. Get help right away if:  You have increasing fatigue or weakness.  You lose your appetite.  You cannot eat or drink without vomiting.  You develop jaundice or your jaundice gets worse.  You bruise or bleed easily. Summary  Hepatitis C is a viral infection of the liver. It can lead to scarring of the liver (cirrhosis), liver failure, or liver cancer.  The hepatitis C virus (HCV) causes this condition. The virus can pass from person to person (is contagious).  You should not take any medicines unless approved by your health care provider. This includes over-the-counter medicines and birth control pills. This information is not intended to replace advice given to you by your health care provider. Make sure you discuss any questions you have with your health care provider. Document Released: 09/20/2000 Document Revised: 10/29/2016 Document Reviewed: 10/29/2016 Elsevier Interactive Patient Education  [employer].

## 2020-03-23 NOTE — Progress Notes (Signed)
Subjective:    Patient ID: Timothy Ramsey, male    DOB: 05-Sep-1966, 54 y.o.   MRN: [id number]  Chief Complaint  Patient presents with  . New Patient (Initial Visit)    Has concerns about MRSA per urgent care provider when he scratched his nose and it enlarged and had to be to drained. Reports no Hep C. symptoms.    HPI:  Timothy Ramsey is a 54 y.o. male with previous medical history of hepatitis C, cellulitis of the right hand, essential hypertension, and generalized anxiety disorder presenting today for evaluation and treatment of hepatitis C.  Timothy Ramsey was initially diagnosed with hepatitis C in 2016 upon entering rehabilitation for IV drug use.  He proceeded to receive treatment with 12 weeks of Epclusa in 2017 completing the full 12 weeks.  Unclear if he completed his SVR visit.  He did have a relapse of IV drug use following completion of treatment.  Currently asymptomatic and denies abdominal pain, nausea, vomiting, scleral icterus, or jaundice.  No personal or family history of liver disease.  Denies previous history of blood transfusions prior to 1992, sharing of toothbrushes or razors, homemade tattoos, or sexual contact with known positive partner.  No current recreational or illicit drug use, tobacco use, or alcohol consumption.  Most recent blood work completed on 12/26/2019 with hepatitis C positive antibody and HCV RNA level of 178,000.  Allergies  Allergen Reactions  . Codeine   . Hydrocodone-Acetaminophen Other (See Comments)    Drowsiness  . Penicillin G Rash      Outpatient Medications Prior to Visit  Medication Sig Dispense Refill  . amLODipine (NORVASC) 10 MG tablet Take 10 mg by mouth daily.    Marland Kitchen buPROPion (WELLBUTRIN XL) 150 MG 24 hr tablet Take with 300 for a total of 450 (Patient taking differently: Take 150 mg by mouth daily. Take with Wellbutrin xl 300 to make 450 mg) 90 tablet 0  . buPROPion (WELLBUTRIN XL) 300 MG 24 hr tablet Take 1 tablet (300 mg total)  by mouth daily. (Patient taking differently: Take 300 mg by mouth daily. Take with Wellbutrin xl 150 to make 450 mg) 90 tablet 0  . lisinopril (ZESTRIL) 20 MG tablet Take 20 mg by mouth 2 (two) times daily.    . mirtazapine (REMERON) 15 MG tablet Take 15 mg by mouth at bedtime.    . mupirocin ointment (BACTROBAN) 2 % SMARTSIG:1 Application Topical 2-3 Times Daily    . sulfamethoxazole-trimethoprim (BACTRIM DS) 800-160 MG tablet Take 1 tablet by mouth 2 (two) times daily.    Marland Kitchen doxycycline (VIBRAMYCIN) 100 MG capsule Take 1 capsule (100 mg total) by mouth 2 (two) times daily. (Patient not taking: Reported on 03/23/2020) 20 capsule 0  . gentamicin (GARAMYCIN) 0.3 % ophthalmic solution Place 1 drop into the left eye every 6 (six) hours. (Patient not taking: Reported on 03/23/2020)    . hydrochlorothiazide (HYDRODIURIL) 25 MG tablet Take 25 mg by mouth daily.  (Patient not taking: Reported on 03/23/2020)     No facility-administered medications prior to visit.     Past Medical History:  Diagnosis Date  . Addiction to drug (HCC)   . Anxiety   . Depression   . Hepatitis C   . History of COVID-19 02/2019      Past Surgical History:  Procedure Laterality Date  . I & D EXTREMITY Right 12/27/2019   Procedure: IRRIGATION AND DEBRIDEMENT RIGHT WRIST;  Surgeon: Bradly Bienenstock, MD;  Location[facility]  OR;  Service: Orthopedics;  Laterality: Right;  . KNEE SURGERY        Family History  Problem Relation Age of Onset  . Hypertension Other       Social History   Socioeconomic History  . Marital status: Single    Spouse name: Not on file  . Number of children: Not on file  . Years of education: Not on file  . Highest education level: Not on file  Occupational History  . Not on file  Tobacco Use  . Smoking status: Former Smoker    Types: Cigarettes    Quit date: 02/07/2015    Years since quitting: 5.1  . Smokeless tobacco: Former Systems developer    Types: Snuff    Quit date: 12/14/2016  Vaping Use  .  Vaping Use: Never used  Substance and Sexual Activity  . Alcohol use: Yes    Comment: 4 beers a week  . Drug use: No  . Sexual activity: Yes    Birth control/protection: None  Other Topics Concern  . Not on file  Social History Narrative  . Not on file   Social Determinants of Health   Financial Resource Strain:   . Difficulty of Paying Living Expenses:   Food Insecurity:   . Worried About Charity fundraiser in the Last Year:   . Arboriculturist in the Last Year:   Transportation Needs:   . Film/video editor (Medical):   Marland Kitchen Lack of Transportation (Non-Medical):   Physical Activity:   . Days of Exercise per Week:   . Minutes of Exercise per Session:   Stress:   . Feeling of Stress :   Social Connections:   . Frequency of Communication with Friends and Family:   . Frequency of Social Gatherings with Friends and Family:   . Attends Religious Services:   . Active Member of Clubs or Organizations:   . Attends Archivist Meetings:   Marland Kitchen Marital Status:   Intimate Partner Violence:   . Fear of Current or Ex-Partner:   . Emotionally Abused:   Marland Kitchen Physically Abused:   . Sexually Abused:       Review of Systems  Constitutional: Negative for chills, diaphoresis, fatigue and fever.  Respiratory: Negative for cough, chest tightness, shortness of breath and wheezing.   Cardiovascular: Negative for chest pain.  Gastrointestinal: Negative for abdominal distention, abdominal pain, constipation, diarrhea, nausea and vomiting.  Neurological: Negative for weakness and headaches.  Hematological: Does not bruise/bleed easily.       Objective:    Pulse 66   Temp 98 F (36.7 C)   Ht 6\' 2"  (1.88 m)   Wt 203 lb (92.1 kg)   SpO2 98%   BMI 26.06 kg/m  Nursing note and vital signs reviewed.  Physical Exam Constitutional:      General: He is not in acute distress.    Appearance: He is well-developed.  Cardiovascular:     Rate and Rhythm: Normal rate and regular  rhythm.     Heart sounds: Normal heart sounds. No murmur heard.  No friction rub. No gallop.   Pulmonary:     Effort: Pulmonary effort is normal. No respiratory distress.     Breath sounds: Normal breath sounds. No wheezing or rales.  Chest:     Chest wall: No tenderness.  Abdominal:     General: Bowel sounds are normal. There is no distension.     Palpations: Abdomen is soft. There is  no mass.     Tenderness: There is no abdominal tenderness. There is no guarding or rebound.  Skin:    General: Skin is warm and dry.  Neurological:     Mental Status: He is alert and oriented to person, place, and time.  Psychiatric:        Behavior: Behavior normal.        Thought Content: Thought content normal.        Judgment: Judgment normal.         Assessment & Plan:   Patient Active Problem List   Diagnosis Date Noted  . Chronic hepatitis C with hepatic coma (HCC) 03/23/2020  . Elevated LFTs 12/26/2019  . Cellulitis of right hand 12/25/2019  . History of hepatitis C 12/25/2019  . Essential hypertension 12/25/2019  . Cellulitis 12/25/2019  . Generalized anxiety disorder 09/18/2016  . SEBACEOUS CYST, SCALP 05/18/2009     Problem List Items Addressed This Visit      Digestive   Chronic hepatitis C with hepatic coma (HCC) - Primary    Mr. Mach is a 54 year old male with chronic hepatitis C previously treated with Epclusa for 12 weeks and unclear if treatment was successful versus reinfection.  Most recent blood work shows a viral load of 178,000.  He is unclear as to which genotype he had previously.  Discussed the need for additional blood work to determine current genotype, liver fibrosis score, and NS 5 a resistance.  Previous HIV and hepatitis B screening negative.  Currently asymptomatic.  Plan for treatment pending blood work results.      Relevant Medications   mupirocin ointment (BACTROBAN) 2 %   sulfamethoxazole-trimethoprim (BACTRIM DS) 800-160 MG tablet   Other  Relevant Orders   Hepatitis C genotype   Hepatitis C RNA quantitative   Liver Fibrosis, FibroTest-ActiTest   CBC   Hepatic function panel   HCV Viral RNA Gen3 NS5a Drug Resist- ([facility])       I am having Alakai F. Colson maintain his hydrochlorothiazide, buPROPion, buPROPion, mirtazapine, amLODipine, gentamicin, doxycycline, lisinopril, mupirocin ointment, and sulfamethoxazole-trimethoprim.   Follow-up: Return Pending blood work results.Marcos Eke, MSN, FNP-C Nurse Practitioner [facility] Main number: [phone]

## 2020-03-23 NOTE — Assessment & Plan Note (Signed)
Timothy Ramsey is a 54 year old male with chronic hepatitis C previously treated with Epclusa for 12 weeks and unclear if treatment was successful versus reinfection.  Most recent blood work shows a viral load of 178,000.  He is unclear as to which genotype he had previously.  Discussed the need for additional blood work to determine current genotype, liver fibrosis score, and NS 5 a resistance.  Previous HIV and hepatitis B screening negative.  Currently asymptomatic.  Plan for treatment pending blood work results.

## 2020-04-04 LAB — LIVER FIBROSIS, FIBROTEST-ACTITEST
ALT: 250 U/L — ABNORMAL HIGH (ref 9–46)
Alpha-2-Macroglobulin: 439 mg/dL — ABNORMAL HIGH (ref 106–279)
Apolipoprotein A1: 179 mg/dL — ABNORMAL HIGH (ref 94–176)
Bilirubin: 0.4 mg/dL (ref 0.2–1.2)
Fibrosis Score: 0.45
GGT: 38 U/L (ref 3–95)
Haptoglobin: 144 mg/dL (ref 43–212)
Necroinflammat ACT Score: 0.89
Reference ID: 3449145

## 2020-04-04 LAB — CBC
HCT: 48.5 % (ref 38.5–50.0)
Hemoglobin: 16.5 g/dL (ref 13.2–17.1)
MCH: 31.4 pg (ref 27.0–33.0)
MCHC: 34 g/dL (ref 32.0–36.0)
MCV: 92.2 fL (ref 80.0–100.0)
MPV: 11.2 fL (ref 7.5–12.5)
Platelets: 209 10*3/uL (ref 140–400)
RBC: 5.26 10*6/uL (ref 4.20–5.80)
RDW: 13 % (ref 11.0–15.0)
WBC: 6.4 10*3/uL (ref 3.8–10.8)

## 2020-04-04 LAB — HEPATIC FUNCTION PANEL
AG Ratio: 1.4 (calc) (ref 1.0–2.5)
ALT: 253 U/L — ABNORMAL HIGH (ref 9–46)
AST: 127 U/L — ABNORMAL HIGH (ref 10–35)
Albumin: 4.2 g/dL (ref 3.6–5.1)
Alkaline phosphatase (APISO): 80 U/L (ref 35–144)
Bilirubin, Direct: 0.2 mg/dL (ref 0.0–0.2)
Globulin: 3.1 g/dL (calc) (ref 1.9–3.7)
Indirect Bilirubin: 0.4 mg/dL (calc) (ref 0.2–1.2)
Total Bilirubin: 0.6 mg/dL (ref 0.2–1.2)
Total Protein: 7.3 g/dL (ref 6.1–8.1)

## 2020-04-04 LAB — HCV VIRAL RNA GEN3 NS5A DRUG RESIST: HCV NS5a Subtype: NOT DETECTED

## 2020-04-04 LAB — HEPATITIS C GENOTYPE

## 2020-04-04 LAB — HEPATITIS C RNA QUANTITATIVE
HCV Quantitative Log: 4.53 Log IU/mL — ABNORMAL HIGH
HCV RNA, PCR, QN: 33800 IU/mL — ABNORMAL HIGH

## 2020-04-05 ENCOUNTER — Other Ambulatory Visit: Payer: Self-pay | Admitting: Family

## 2020-04-05 MED ORDER — MAVYRET 100-40 MG PO TABS: 3.0000 | ORAL_TABLET | Freq: Every day | ORAL | 1 refills | Status: DC

## 2020-04-05 NOTE — Progress Notes (Signed)
Timothy Ramsey has Genotype 3 chronic hepatitis C with initial viral load of 33,800 and Fibrosis score of F1-F2 with corresponding FIB4 of 1.96 and APRI of 1.519. Will plan for treatment with 8 weeks of Mavyret.

## 2020-04-11 ENCOUNTER — Telehealth: Payer: Self-pay | Admitting: Pharmacy Technician

## 2020-04-11 NOTE — Telephone Encounter (Addendum)
[facility] Patient Advocate Encounter   Received notification from [employer] that prior authorization for Mavyret is required.   PA submitted on 04/11/2020 Key [id number] Status is APPROVED    [facility] will continue to follow.  I left the patient a HIPPA compliant voicemail to call so that I can update.   Netty Starring. Dimas Aguas CPhT Specialty Pharmacy Patient [facility] for Infectious Disease Phone: [phone] Fax:  [phone]

## 2020-04-12 ENCOUNTER — Telehealth: Payer: Self-pay | Admitting: Pharmacy Technician

## 2020-04-12 ENCOUNTER — Other Ambulatory Visit: Payer: Self-pay | Admitting: Pharmacist

## 2020-04-12 DIAGNOSIS — B182 Chronic viral hepatitis C: Secondary | ICD-10-CM

## 2020-04-12 MED ORDER — MAVYRET 100-40 MG PO TABS: 3.0000 | ORAL_TABLET | Freq: Every day | ORAL | 1 refills | Status: DC

## 2020-04-12 NOTE — Telephone Encounter (Signed)
[facility] Patient Advocate Encounter   Was successful in obtaining a Mavyret copay card.  This copay card will make the patients copay $5.  I have spoken with the patient and if needed, will share this information with his pharmacy, [facility].   Netty Starring. Dimas Aguas CPhT Specialty Pharmacy Patient [facility] for Infectious Disease Phone: [phone] Fax:  [phone]

## 2020-04-12 NOTE — Progress Notes (Signed)
Patient's insurance requires Mavyret to be filled at [facility]. Resending Rx now. Kathie Rhodes will call patient and coordinate.

## 2020-04-12 NOTE — Telephone Encounter (Signed)
[facility] Patient Advocate Encounter   Was successful in obtaining a Mavyret copay card.  This copay card will make the patients copay $5, should he need it.   Netty Starring. Dimas Aguas CPhT Specialty Pharmacy Patient [facility] for Infectious Disease Phone: [phone] Fax:  [phone]

## 2020-04-12 NOTE — Telephone Encounter (Signed)
Patient called back and will call [facility] and has copay card.  Netty Starring. Dimas Aguas CPhT Specialty Pharmacy Patient [facility] for Infectious Disease Phone: [phone] Fax:  [phone]

## 2020-04-17 ENCOUNTER — Encounter: Payer: Self-pay | Admitting: Pharmacy Technician

## 2020-04-17 ENCOUNTER — Telehealth: Payer: Self-pay | Admitting: Pharmacist

## 2020-04-17 NOTE — Telephone Encounter (Signed)
Patient is approved to receive Mavyret x 8 weeks for chronic Hepatitis C infection. Counseled patient to take all three tablets of Mavyret daily with food.  Counseled patient the need to take all three tablets together and to not separate them out during the day. Encouraged patient not to miss any doses and explained how their chance of cure could go down with each dose missed. Counseled patient on what to do if dose is missed - if it is closer to the missed dose take immediately; if closer to next dose then skip dose and take the next dose at the usual time.   Counseled patient on common side effects such as headache, fatigue, and nausea and that these normally decrease with time. I reviewed patient medications and found no drug interactions. Discussed with patient that there are several drug interactions with Mavyret and instructed patient to call the clinic if he wishes to start a new medication during course of therapy. Also advised patient to call if he experiences any side effects. Patient will follow-up with me in the pharmacy clinic on 8/18.

## 2020-04-17 NOTE — Telephone Encounter (Signed)
Agree with the plan. Thanks.

## 2020-04-17 NOTE — Telephone Encounter (Signed)
Talked with pharmacist from [facility]. She confirmed that patient has been treated with Epclusa in the past. I told her that we weren't sure if it was reinfection or failure since we do not have SVR12 labs and patient had a relapse in IVDU.  To be on the safe side, will extend Mavyret treatment out to 16 weeks per guidelines for treatment experienced patients with a sofosbuvir-containing regimen.  Gave verbal order and pharmacy will let us know if anything further needs to happen in regards to the prior authorization.

## 2020-04-18 NOTE — Telephone Encounter (Signed)
Thank you :)

## 2020-04-20 ENCOUNTER — Telehealth: Payer: Self-pay | Admitting: Pharmacy Technician

## 2020-04-20 ENCOUNTER — Encounter: Payer: Self-pay | Admitting: Pharmacist

## 2020-04-20 NOTE — Telephone Encounter (Addendum)
[facility] Patient Advocate Encounter   Received notification from [facility] that prior authorization for Mavyret is required.  This is a new authorization because the needed time has increased from 8 weeks to 16 weeks.   I called [facility] for the extension, then faxed requested documents/labs.    [facility] will continue to follow.   Netty Starring. Dimas Aguas CPhT Specialty Pharmacy Patient [facility] for Infectious Disease Phone: [phone] Fax:  [phone]

## 2020-04-27 ENCOUNTER — Encounter: Payer: Self-pay | Admitting: Pharmacy Technician

## 2020-04-27 NOTE — Telephone Encounter (Signed)
UPDATE:  [facility] responded stating PA is dismissed and the medication will be covered for as long as the physician writes for it.  I will reach out to [facility] to let them know to fill it.  Netty Starring. Dimas Aguas CPhT Specialty Pharmacy Patient [facility] for Infectious Disease Phone: [phone] Fax:  [phone]

## 2020-05-11 IMAGING — US US ABDOMEN LIMITED
1 series · 14 of 25 positions shown · non-contrast
Comparison: June 16, 2018.

CLINICAL DATA: Elevated liver function tests.

EXAM:
ULTRASOUND ABDOMEN LIMITED RIGHT UPPER QUADRANT

[Series 1: us abdomen limited ruq · 14 of 52 slices shown]
[im 1/52]
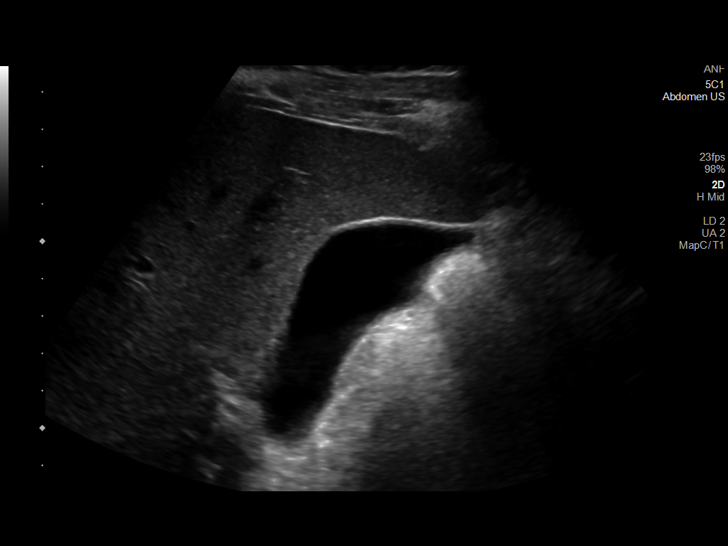
[im 5/52]
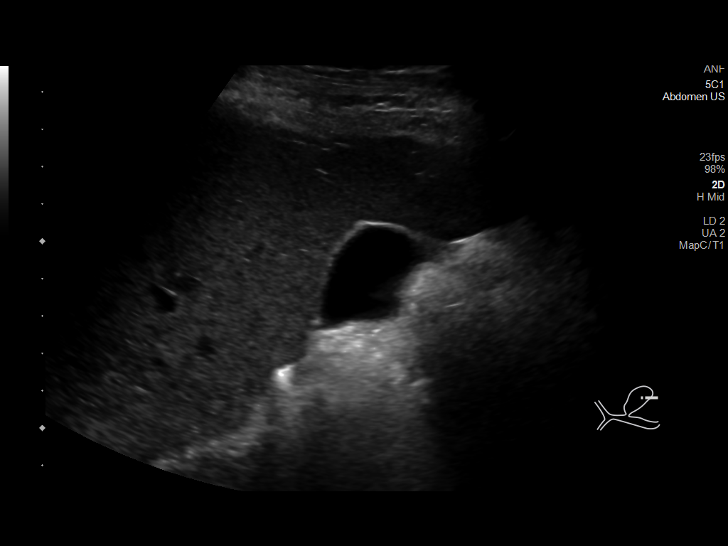
[im 9/52]
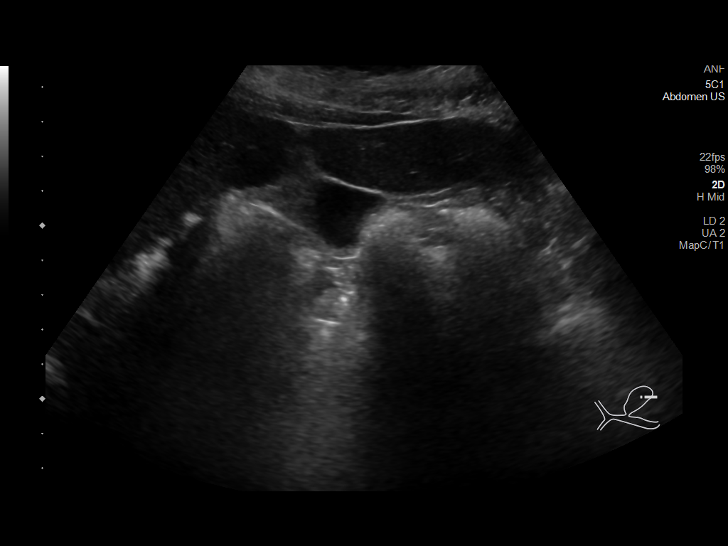
[im 13/52]
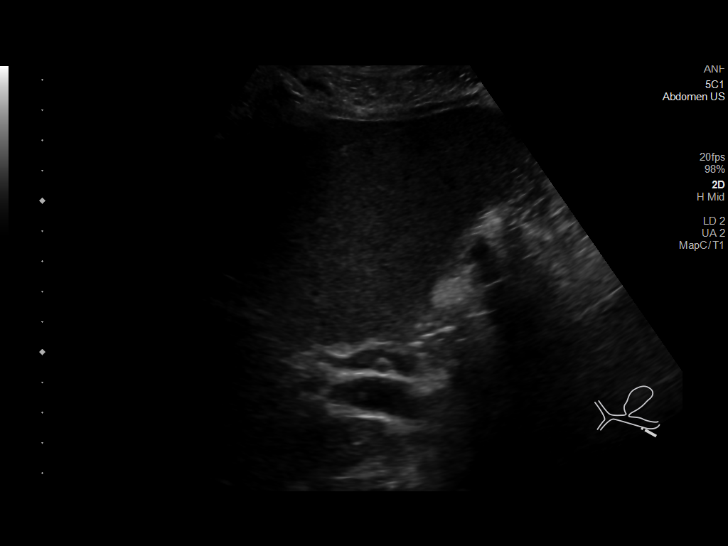
[im 18/52]
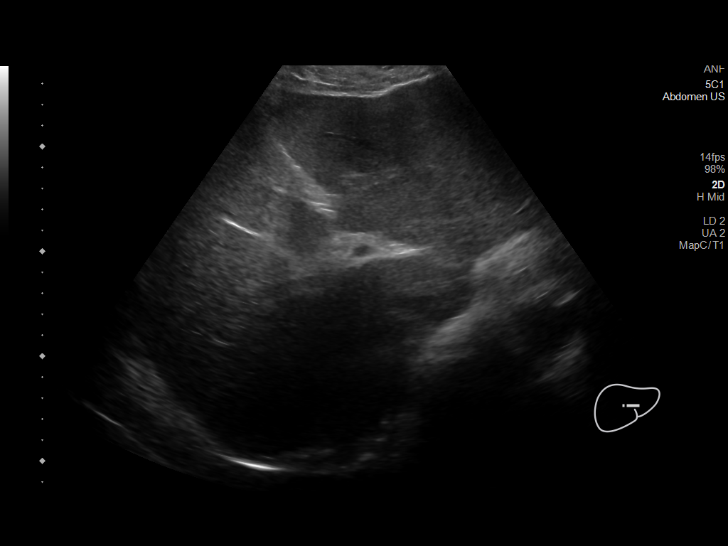
[im 20/52]
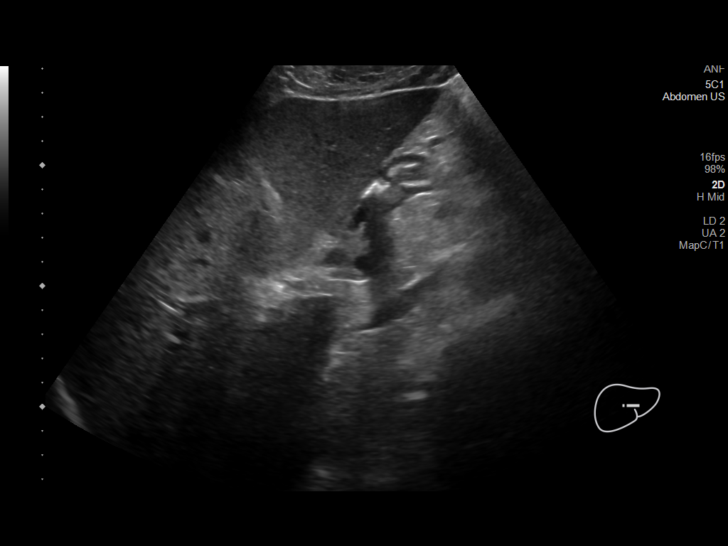
[im 24/52]
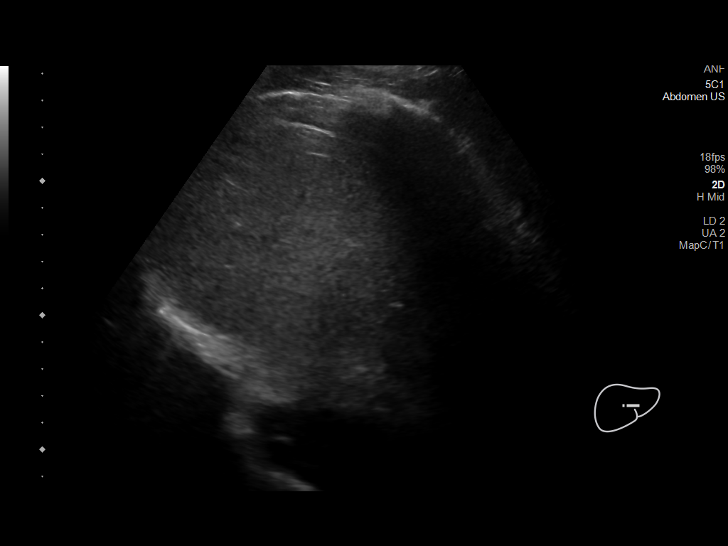
[im 28/52]
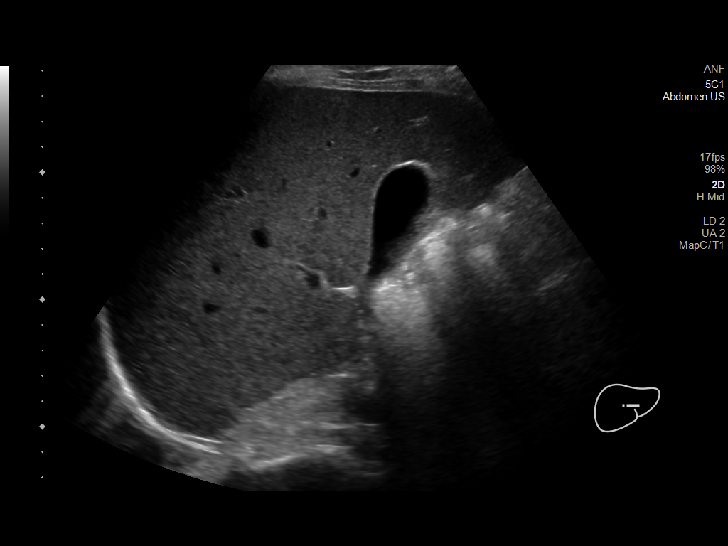
[im 32/52]
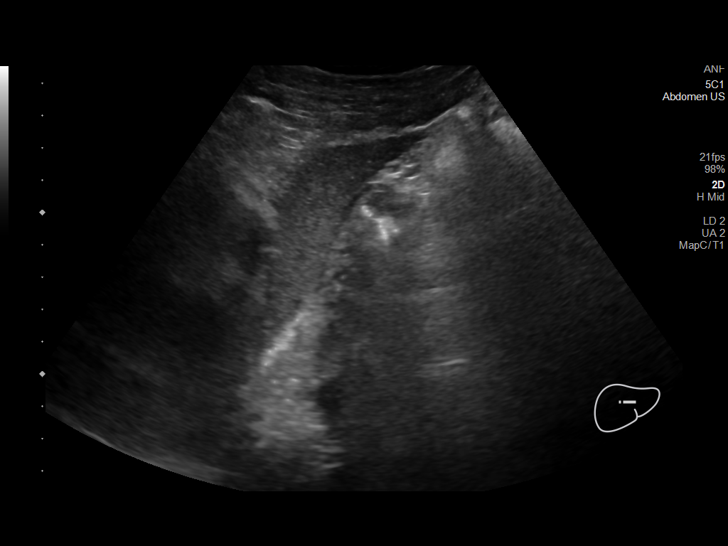
[im 35/52]
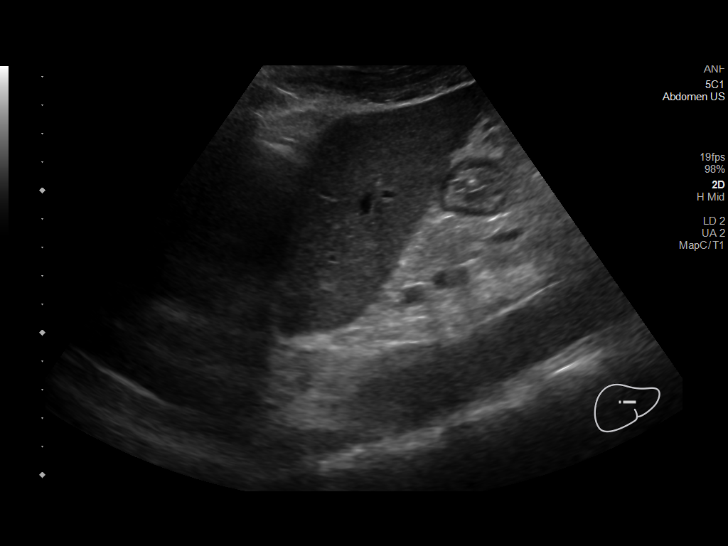
[im 39/52]
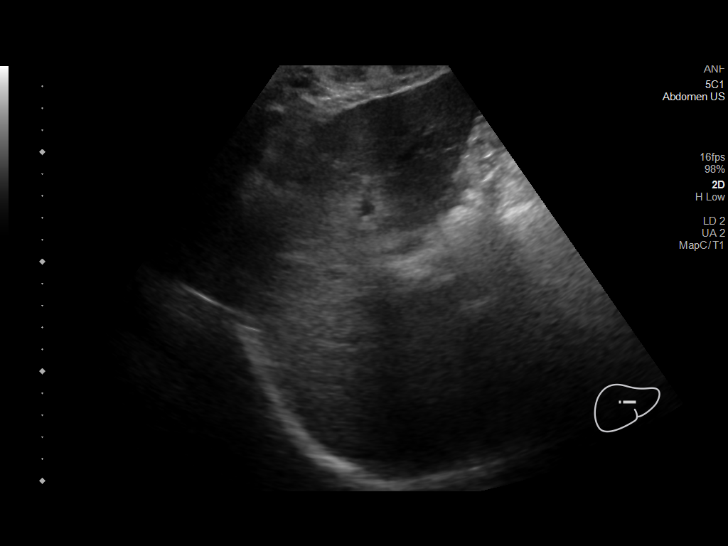
[im 43/52]
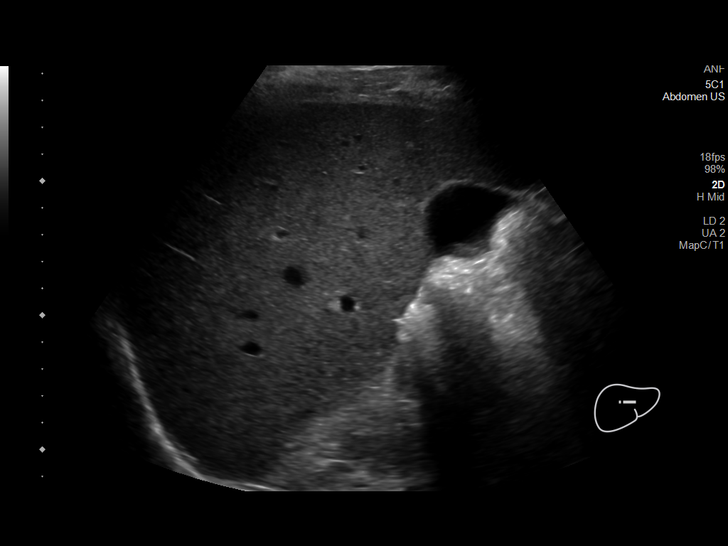
[im 47/52]
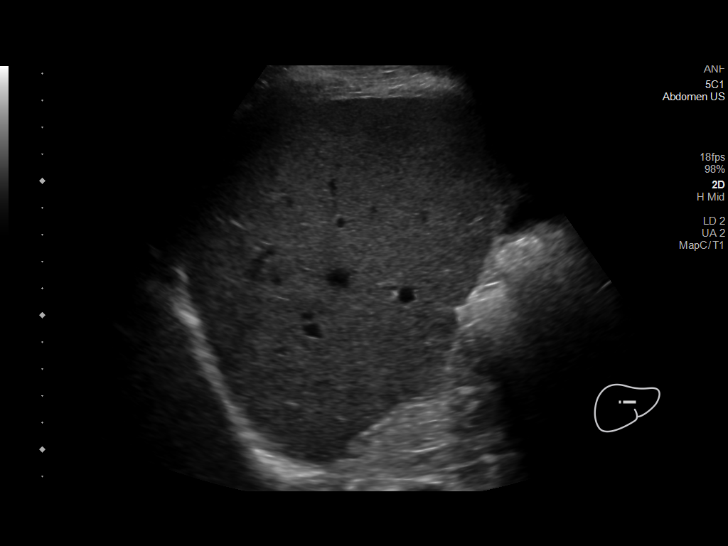
[im 52/52]
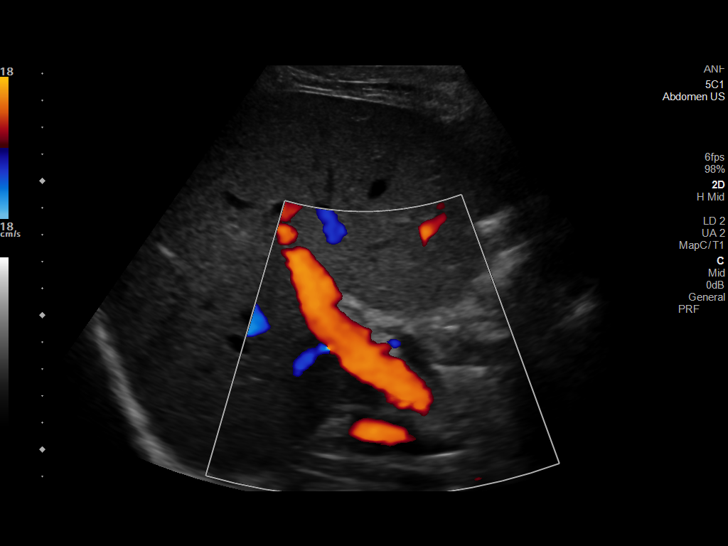

[14 of 25 positions shown; findings below may reference images not displayed]

FINDINGS: Gallbladder:

No gallstones or wall thickening visualized. No sonographic Murphy
sign noted by sonographer.

Common bile duct:

Diameter: 8 mm proximal which is mildly dilated, but no intrahepatic
biliary dilatation is noted. Distal portion could not be visualized
due to overlying bowel gas.

Liver:

No focal lesion identified. Within normal limits in parenchymal
echogenicity. Portal vein is patent on color Doppler imaging with
normal direction of blood flow towards the liver.

Other: None.
IMPRESSION: Common bile duct is mildly dilated proximally at 8 mm, but no
intrahepatic biliary dilatation is noted. Distal portion cannot be
visualized due to overlying bowel gas. Correlation with liver
function tests is recommended to rule out biliary obstruction. No
other abnormality seen in the right upper quadrant of the abdomen.

## 2020-05-24 ENCOUNTER — Ambulatory Visit: Payer: 59 | Admitting: Pharmacist

## 2020-05-31 ENCOUNTER — Other Ambulatory Visit: Payer: Self-pay

## 2020-05-31 ENCOUNTER — Ambulatory Visit (INDEPENDENT_AMBULATORY_CARE_PROVIDER_SITE_OTHER): Payer: 59 | Admitting: Pharmacist

## 2020-05-31 DIAGNOSIS — Z79899 Other long term (current) drug therapy: Secondary | ICD-10-CM | POA: Diagnosis not present

## 2020-05-31 DIAGNOSIS — B182 Chronic viral hepatitis C: Secondary | ICD-10-CM

## 2020-05-31 NOTE — Progress Notes (Signed)
HPI: Timothy Ramsey is a 54 y.o. male who presents to the [facility] pharmacy clinic for Hepatitis C follow-up.  Medication: Mavyret x 16 weeks (treatment experienced)  Start Date: 04/29/20  Hepatitis C Genotype: 1a  Fibrosis Score: F1/F2  Hepatitis C RNA: 33,800 on 03/23/20  Patient Active Problem List   Diagnosis Date Noted  . Chronic hepatitis C with hepatic coma (HCC) 03/23/2020  . Elevated LFTs 12/26/2019  . Cellulitis of right hand 12/25/2019  . History of hepatitis C 12/25/2019  . Essential hypertension 12/25/2019  . Cellulitis 12/25/2019  . Generalized anxiety disorder 09/18/2016  . SEBACEOUS CYST, SCALP 05/18/2009    Patient's Medications  New Prescriptions   No medications on file  Previous Medications   AMLODIPINE (NORVASC) 10 MG TABLET    Take 10 mg by mouth daily.   BUPROPION (WELLBUTRIN XL) 150 MG 24 HR TABLET    Take with 300 for a total of 450   BUPROPION (WELLBUTRIN XL) 300 MG 24 HR TABLET    Take 1 tablet (300 mg total) by mouth daily.   DOXYCYCLINE (VIBRAMYCIN) 100 MG CAPSULE    Take 1 capsule (100 mg total) by mouth 2 (two) times daily.   GENTAMICIN (GARAMYCIN) 0.3 % OPHTHALMIC SOLUTION    Place 1 drop into the left eye every 6 (six) hours.   GLECAPREVIR-PIBRENTASVIR (MAVYRET) 100-40 MG TABS    Take 3 tablets by mouth daily with breakfast.   HYDROCHLOROTHIAZIDE (HYDRODIURIL) 25 MG TABLET    Take 25 mg by mouth daily.    LISINOPRIL (ZESTRIL) 20 MG TABLET    Take 20 mg by mouth 2 (two) times daily.   MIRTAZAPINE (REMERON) 15 MG TABLET    Take 15 mg by mouth at bedtime.   MUPIROCIN OINTMENT (BACTROBAN) 2 %    SMARTSIG:1 Application Topical 2-3 Times Daily   SULFAMETHOXAZOLE-TRIMETHOPRIM (BACTRIM DS) 800-160 MG TABLET    Take 1 tablet by mouth 2 (two) times daily.  Modified Medications   No medications on file  Discontinued Medications   No medications on file    Allergies: Allergies  Allergen Reactions  . Codeine   . Hydrocodone-Acetaminophen Other (See  Comments)    Drowsiness  . Penicillin G Rash    Past Medical History: Past Medical History:  Diagnosis Date  . Addiction to drug (HCC)   . Anxiety   . Depression   . Hepatitis C   . History of COVID-19 02/2019    Social History: Social History   Socioeconomic History  . Marital status: Single    Spouse name: Not on file  . Number of children: Not on file  . Years of education: Not on file  . Highest education level: Not on file  Occupational History  . Not on file  Tobacco Use  . Smoking status: Former Smoker    Types: Cigarettes    Quit date: 02/07/2015    Years since quitting: 5.3  . Smokeless tobacco: Former Neurosurgeon    Types: Snuff    Quit date: 12/14/2016  Vaping Use  . Vaping Use: Never used  Substance and Sexual Activity  . Alcohol use: Yes    Comment: 4 beers a week  . Drug use: No  . Sexual activity: Yes    Birth control/protection: None  Other Topics Concern  . Not on file  Social History Narrative  . Not on file   Social Determinants of Health   Financial Resource Strain:   . Difficulty of Paying Living Expenses:  Not on file  Food Insecurity:   . Worried About Programme researcher, broadcasting/film/video in the Last Year: Not on file  . Ran Out of Food in the Last Year: Not on file  Transportation Needs:   . Lack of Transportation (Medical): Not on file  . Lack of Transportation (Non-Medical): Not on file  Physical Activity:   . Days of Exercise per Week: Not on file  . Minutes of Exercise per Session: Not on file  Stress:   . Feeling of Stress : Not on file  Social Connections:   . Frequency of Communication with Friends and Family: Not on file  . Frequency of Social Gatherings with Friends and Family: Not on file  . Attends Religious Services: Not on file  . Active Member of Clubs or Organizations: Not on file  . Attends Banker Meetings: Not on file  . Marital Status: Not on file    Labs: Hepatitis C Lab Results  Component Value Date   HCVGENOTYPE  1a 03/23/2020   HCVRNAPCRQN 33,800 (H) 03/23/2020   FIBROSTAGE F1-F2 03/23/2020   Hepatitis B Lab Results  Component Value Date   HEPBSAG NON REACTIVE 12/26/2019   Hepatitis A No results found for: HAV HIV Lab Results  Component Value Date   HIV NON REACTIVE 12/26/2019   Lab Results  Component Value Date   CREATININE 1.26 (H) 12/28/2019   CREATININE 0.81 12/27/2019   CREATININE 1.11 12/26/2019   CREATININE 1.03 12/25/2019   CREATININE 0.8 07/11/2010   Lab Results  Component Value Date   AST 127 (H) 03/23/2020   AST 188 (H) 12/28/2019   AST 196 (H) 12/27/2019   ALT 250 (H) 03/23/2020   ALT 253 (H) 03/23/2020   ALT 456 (H) 12/28/2019   INR 1.0 12/26/2019    Assessment: Timothy Ramsey is here today to follow up for his chronic Hepatitis C infection.  He was originally approved for 8 weeks of Mavyret but we extended out to 16 weeks since he is treatment experienced.   He is tolerating it well with no side effects. He takes all three tablets together with food in the morning every day. No missed doses so far.  He has already started his 2nd month. No issues getting his refills on time. No problems today. Will check labs and have him see Timothy Ramsey at end of treatment in 3 months.  Plan: - Continue Mavyret x 16 weeks - Hep C RNA + CMET today - F/u with Timothy Ramsey 11/29 at 430pm   Timothy Ramsey L. Jemery Stacey, PharmD, BCIDP, AAHIVP, CPP Clinical Pharmacist Practitioner Infectious Diseases Clinical Pharmacist [facility] 05/31/2020, 3:55 PM

## 2020-06-01 ENCOUNTER — Encounter: Payer: Self-pay | Admitting: Pharmacist

## 2020-06-02 LAB — COMPREHENSIVE METABOLIC PANEL
AG Ratio: 1.6 (calc) (ref 1.0–2.5)
ALT: 22 U/L (ref 9–46)
AST: 21 U/L (ref 10–35)
Albumin: 4.4 g/dL (ref 3.6–5.1)
Alkaline phosphatase (APISO): 67 U/L (ref 35–144)
BUN/Creatinine Ratio: 23 (calc) — ABNORMAL HIGH (ref 6–22)
BUN: 27 mg/dL — ABNORMAL HIGH (ref 7–25)
CO2: 28 mmol/L (ref 20–32)
Calcium: 9.6 mg/dL (ref 8.6–10.3)
Chloride: 101 mmol/L (ref 98–110)
Creat: 1.19 mg/dL (ref 0.70–1.33)
Globulin: 2.7 g/dL (calc) (ref 1.9–3.7)
Glucose, Bld: 85 mg/dL (ref 65–99)
Potassium: 5.1 mmol/L (ref 3.5–5.3)
Sodium: 138 mmol/L (ref 135–146)
Total Bilirubin: 0.6 mg/dL (ref 0.2–1.2)
Total Protein: 7.1 g/dL (ref 6.1–8.1)

## 2020-06-02 LAB — HEPATITIS C RNA QUANTITATIVE
HCV RNA, PCR, QN (Log): <1.18 log IU/mL — ABNORMAL HIGH
HCV RNA, PCR, QN: <15 IU/mL — ABNORMAL HIGH

## 2020-09-04 ENCOUNTER — Ambulatory Visit (INDEPENDENT_AMBULATORY_CARE_PROVIDER_SITE_OTHER): Payer: 59 | Admitting: Family

## 2020-09-04 ENCOUNTER — Encounter: Payer: Self-pay | Admitting: Family

## 2020-09-04 ENCOUNTER — Other Ambulatory Visit: Payer: Self-pay

## 2020-09-04 VITALS — Ht 74.0 in | Wt 203.0 lb

## 2020-09-04 DIAGNOSIS — B182 Chronic viral hepatitis C: Secondary | ICD-10-CM | POA: Diagnosis not present

## 2020-09-04 NOTE — Assessment & Plan Note (Signed)
Timothy Ramsey has completed 16 weeks of Mavyret for genotype 1 a chronic hepatitis C as treatment experienced with no resistance.  Most recent blood work with a viral load that was undetectable.  Recheck viral load today.  Discussed the plan of care to include blood work today followed by blood work in 3 months to determine sustained viremic response.  No further hepatocellular carcinoma screenings are necessary with fibrosis score of F1/F2.  If he does need to be screened for hepatitis C in the future they will need to use a hepatitis C RNA level as the hepatitis C antibody will always remain positive.  Plan for follow-up in 3 months through lab visit for care visit.

## 2020-09-04 NOTE — Patient Instructions (Signed)
Nice to see you.  We will check your blood work today.  Plan for a lab visit in 3 months or sooner if needed for your cure visit.  If you need further testing in the future they will need to check a hepatitis C RNA level as the antibody test will always be positive.  Based on your previous tests no further liver cancer screenings are necessary.  Have a great day and stay safe!  Happy holidays!

## 2020-09-04 NOTE — Progress Notes (Signed)
Subjective:    Patient ID: Timothy Ramsey, male    DOB: 07-Dec-1965, 54 y.o.   MRN: [id number]  Chief Complaint  Patient presents with  . Follow-up     HPI:  Timothy Ramsey is a 53 y.o. male with genotype 1 a chronic hepatitis C with viral load of 33,800 and fibrosis score of F1/F2 and no resistance who has completed 16 weeks of Mavyret secondary to being treatment experienced who is here today for end of treatment visit.  Timothy Ramsey has completed his 16 weeks of Mavyret ending approximately 2 weeks ago.  Tolerated medication with no adverse side effects and occasional missed dose.  No current symptoms including abdominal pain, nausea, vomiting, scleral icterus, or jaundice.   Allergies  Allergen Reactions  . Codeine   . Hydrocodone-Acetaminophen Other (See Comments)    Drowsiness  . Penicillin G Rash      Outpatient Medications Prior to Visit  Medication Sig Dispense Refill  . ARIPiprazole (ABILIFY) 10 MG tablet Take 10 tablets by mouth.    Marland Kitchen buPROPion (WELLBUTRIN XL) 150 MG 24 hr tablet Take with 300 for a total of 450 (Patient taking differently: Take 150 mg by mouth daily. Take with Wellbutrin xl 300 to make 450 mg) 90 tablet 0  . buPROPion (WELLBUTRIN XL) 300 MG 24 hr tablet Take 1 tablet (300 mg total) by mouth daily. (Patient taking differently: Take 300 mg by mouth daily. Take with Wellbutrin xl 150 to make 450 mg) 90 tablet 0  . lisinopril (ZESTRIL) 20 MG tablet Take 20 mg by mouth 2 (two) times daily.    . propranolol (INDERAL) 20 MG tablet Take 20 mg by mouth 2 (two) times daily.    . tadalafil (CIALIS) 10 MG tablet Take 10 mg by mouth as needed.    . topiramate (TOPAMAX) 100 MG tablet Take 100 mg by mouth daily.    . Glecaprevir-Pibrentasvir (MAVYRET) 100-40 MG TABS Take 3 tablets by mouth daily with breakfast. 84 tablet 1  . amLODipine (NORVASC) 10 MG tablet Take 10 mg by mouth daily. (Patient not taking: Reported on 09/04/2020)    . doxycycline (VIBRAMYCIN) 100 MG  capsule Take 1 capsule (100 mg total) by mouth 2 (two) times daily. (Patient not taking: Reported on 09/04/2020) 20 capsule 0  . gentamicin (GARAMYCIN) 0.3 % ophthalmic solution Place 1 drop into the left eye every 6 (six) hours.  (Patient not taking: Reported on 09/04/2020)    . hydrochlorothiazide (HYDRODIURIL) 25 MG tablet Take 25 mg by mouth daily.  (Patient not taking: Reported on 09/04/2020)    . mirtazapine (REMERON) 15 MG tablet Take 15 mg by mouth at bedtime. (Patient not taking: Reported on 09/04/2020)    . mupirocin ointment (BACTROBAN) 2 % SMARTSIG:1 Application Topical 2-3 Times Daily (Patient not taking: Reported on 09/04/2020)    . sulfamethoxazole-trimethoprim (BACTRIM DS) 800-160 MG tablet Take 1 tablet by mouth 2 (two) times daily. (Patient not taking: Reported on 09/04/2020)     No facility-administered medications prior to visit.     Past Medical History:  Diagnosis Date  . Addiction to drug (HCC)   . Anxiety   . Depression   . Hepatitis C   . History of COVID-19 02/2019     Past Surgical History:  Procedure Laterality Date  . I & D EXTREMITY Right 12/27/2019   Procedure: IRRIGATION AND DEBRIDEMENT RIGHT WRIST;  Surgeon: Bradly Bienenstock, MD;  Location: [facility] OR;  Service: Orthopedics;  Laterality: Right;  .  KNEE SURGERY         Review of Systems  Constitutional: Negative for chills, diaphoresis, fatigue and fever.  Respiratory: Negative for cough, chest tightness, shortness of breath and wheezing.   Cardiovascular: Negative for chest pain.  Gastrointestinal: Negative for abdominal distention, abdominal pain, constipation, diarrhea, nausea and vomiting.  Neurological: Negative for weakness and headaches.  Hematological: Does not bruise/bleed easily.      Objective:    Ht 6\' 2"  (1.88 m)   Wt 203 lb (92.1 kg)   BMI 26.06 kg/m  Nursing note and vital signs reviewed.  Physical Exam Constitutional:      General: He is not in acute distress.    Appearance: He  is well-developed.  Cardiovascular:     Rate and Rhythm: Normal rate and regular rhythm.     Heart sounds: Normal heart sounds. No murmur heard.  No friction rub. No gallop.   Pulmonary:     Effort: Pulmonary effort is normal. No respiratory distress.     Breath sounds: Normal breath sounds. No wheezing or rales.  Chest:     Chest wall: No tenderness.  Abdominal:     General: Bowel sounds are normal. There is no distension.     Palpations: Abdomen is soft. There is no mass.     Tenderness: There is no abdominal tenderness. There is no guarding or rebound.  Skin:    General: Skin is warm and dry.  Neurological:     Mental Status: He is alert and oriented to person, place, and time.  Psychiatric:        Behavior: Behavior normal.        Thought Content: Thought content normal.        Judgment: Judgment normal.      Depression screen [facility] 2/9 09/04/2020 03/23/2020  Decreased Interest 0 0  Down, Depressed, Hopeless 0 0  PHQ - 2 Score 0 0       Assessment & Plan:    Patient Active Problem List   Diagnosis Date Noted  . Chronic hepatitis C with hepatic coma (HCC) 03/23/2020  . Elevated LFTs 12/26/2019  . Cellulitis of right hand 12/25/2019  . History of hepatitis C 12/25/2019  . Essential hypertension 12/25/2019  . Cellulitis 12/25/2019  . Generalized anxiety disorder 09/18/2016  . SEBACEOUS CYST, SCALP 05/18/2009     Problem List Items Addressed This Visit      Digestive   Chronic hepatitis C with hepatic coma (HCC) - Primary    Timothy Ramsey has completed 16 weeks of Mavyret for genotype 1 a chronic hepatitis C as treatment experienced with no resistance.  Most recent blood work with a viral load that was undetectable.  Recheck viral load today.  Discussed the plan of care to include blood work today followed by blood work in 3 months to determine sustained viremic response.  No further hepatocellular carcinoma screenings are necessary with fibrosis score of F1/F2.  If he  does need to be screened for hepatitis C in the future they will need to use a hepatitis C RNA level as the hepatitis C antibody will always remain positive.  Plan for follow-up in 3 months through lab visit for care visit.      Relevant Orders   COMPLETE METABOLIC PANEL WITH GFR   Hepatitis C RNA quantitative       I have discontinued Deegan F. Hernan's Mavyret. I am also having him maintain his hydrochlorothiazide, buPROPion, buPROPion, mirtazapine, amLODipine, gentamicin, doxycycline, lisinopril, mupirocin  ointment, sulfamethoxazole-trimethoprim, ARIPiprazole, propranolol, topiramate, and tadalafil.   Follow-up: 3 months or sooner if needed for care visit.   Marcos Eke, MSN, FNP-C Nurse Practitioner [facility] Main number: [phone]

## 2020-09-06 LAB — COMPLETE METABOLIC PANEL WITH GFR
AG Ratio: 1.5 (calc) (ref 1.0–2.5)
ALT: 17 U/L (ref 9–46)
AST: 18 U/L (ref 10–35)
Albumin: 4.3 g/dL (ref 3.6–5.1)
Alkaline phosphatase (APISO): 84 U/L (ref 35–144)
BUN: 20 mg/dL (ref 7–25)
CO2: 25 mmol/L (ref 20–32)
Calcium: 9.9 mg/dL (ref 8.6–10.3)
Chloride: 107 mmol/L (ref 98–110)
Creat: 1.31 mg/dL (ref 0.70–1.33)
GFR, Est African American: 72 mL/min/{1.73_m2} (ref >=60)
GFR, Est Non African American: 62 mL/min/{1.73_m2} (ref >=60)
Globulin: 2.8 g/dL (calc) (ref 1.9–3.7)
Glucose, Bld: 83 mg/dL (ref 65–99)
Potassium: 4.3 mmol/L (ref 3.5–5.3)
Sodium: 140 mmol/L (ref 135–146)
Total Bilirubin: 1 mg/dL (ref 0.2–1.2)
Total Protein: 7.1 g/dL (ref 6.1–8.1)

## 2020-09-06 LAB — HEPATITIS C RNA QUANTITATIVE
HCV Quantitative Log: <1.18 log IU/mL
HCV RNA, PCR, QN: <15 IU/mL

## 2020-11-29 ENCOUNTER — Other Ambulatory Visit: Payer: Self-pay | Admitting: Family

## 2020-11-29 DIAGNOSIS — B182 Chronic viral hepatitis C: Secondary | ICD-10-CM

## 2020-12-06 ENCOUNTER — Other Ambulatory Visit: Payer: 59

## 2020-12-07 ENCOUNTER — Emergency Department (HOSPITAL_COMMUNITY): Payer: 59

## 2020-12-07 ENCOUNTER — Emergency Department (HOSPITAL_COMMUNITY)
Admission: EM | Admit: 2020-12-07 | Discharge: 2020-12-07 | Disposition: A | Discharge status: Home / Self Care | Payer: 59 | Attending: Emergency Medicine | Admitting: Emergency Medicine

## 2020-12-07 ENCOUNTER — Other Ambulatory Visit: Payer: Self-pay

## 2020-12-07 DIAGNOSIS — Z87891 Personal history of nicotine dependence: Secondary | ICD-10-CM | POA: Diagnosis not present

## 2020-12-07 DIAGNOSIS — Z79899 Other long term (current) drug therapy: Secondary | ICD-10-CM | POA: Insufficient documentation

## 2020-12-07 DIAGNOSIS — I10 Essential (primary) hypertension: Secondary | ICD-10-CM | POA: Insufficient documentation

## 2020-12-07 DIAGNOSIS — Z8616 Personal history of COVID-19: Secondary | ICD-10-CM | POA: Insufficient documentation

## 2020-12-07 DIAGNOSIS — R072 Precordial pain: Secondary | ICD-10-CM | POA: Insufficient documentation

## 2020-12-07 DIAGNOSIS — R202 Paresthesia of skin: Secondary | ICD-10-CM | POA: Insufficient documentation

## 2020-12-07 LAB — CBC WITH DIFFERENTIAL/PLATELET
Abs Immature Granulocytes: 0.02 10*3/uL (ref 0.00–0.07)
Basophils Absolute: 0 10*3/uL (ref 0.0–0.1)
Basophils Relative: 1 %
Eosinophils Absolute: 0.2 10*3/uL (ref 0.0–0.5)
Eosinophils Relative: 2 %
HCT: 48.6 % (ref 39.0–52.0)
Hemoglobin: 15.7 g/dL (ref 13.0–17.0)
Immature Granulocytes: 0 %
Lymphocytes Relative: 41 %
Lymphs Abs: 3.6 10*3/uL (ref 0.7–4.0)
MCH: 30.1 pg (ref 26.0–34.0)
MCHC: 32.3 g/dL (ref 30.0–36.0)
MCV: 93.3 fL (ref 80.0–100.0)
Monocytes Absolute: 0.8 10*3/uL (ref 0.1–1.0)
Monocytes Relative: 9 %
Neutro Abs: 4.2 10*3/uL (ref 1.7–7.7)
Neutrophils Relative %: 47 %
Platelets: 201 10*3/uL (ref 150–400)
RBC: 5.21 MIL/uL (ref 4.22–5.81)
RDW: 12.5 % (ref 11.5–15.5)
WBC: 8.8 10*3/uL (ref 4.0–10.5)
nRBC: 0 % (ref 0.0–0.2)

## 2020-12-07 LAB — BASIC METABOLIC PANEL
Anion gap: 11 (ref 5–15)
BUN: 15 mg/dL (ref 6–20)
CO2: 21 mmol/L — ABNORMAL LOW (ref 22–32)
Calcium: 9 mg/dL (ref 8.9–10.3)
Chloride: 107 mmol/L (ref 98–111)
Creatinine, Ser: 1.07 mg/dL (ref 0.61–1.24)
GFR, Estimated: >60 mL/min (ref >60)
Glucose, Bld: 82 mg/dL (ref 70–99)
Potassium: 4.6 mmol/L (ref 3.5–5.1)
Sodium: 139 mmol/L (ref 135–145)

## 2020-12-07 LAB — TROPONIN I (HIGH SENSITIVITY)
Troponin I (High Sensitivity): 4 ng/L (ref <18)
Troponin I (High Sensitivity): 5 ng/L (ref <18)

## 2020-12-07 MED ORDER — AMLODIPINE BESYLATE 10 MG PO TABS: 10.0000 mg | ORAL_TABLET | Freq: Every day | ORAL | 0 refills | Status: AC

## 2020-12-07 MED ORDER — LISINOPRIL 20 MG PO TABS: 20.0000 mg | ORAL_TABLET | Freq: Every day | ORAL | 0 refills | Status: AC

## 2020-12-07 NOTE — ED Triage Notes (Signed)
Pt with central chest pain starting last night immediately after using cocaine. Pt went to UC this morning and was told he was having a widow maker heart attack, so EMS was called. EMS activated code stemi that was cancelled per cards. No pain on arrival to ED, NAD.

## 2020-12-07 NOTE — ED Provider Notes (Signed)
[facility] EMERGENCY DEPARTMENT Provider Note   CSN: [id number] Arrival date & time: 12/07/20  1339     History Chief Complaint  Patient presents with  . Chest Pain    KAELIN BONELLI is a 55 y.o. male with h/o HTN, anxiety, depression, hepatitis C, and polysubstance abuse who presents to the ED for chest pain. Patient smoked crack-cocaine last PM at approximately 2330 and shortly after developed substernal chest pain and numbness down LUE. Symptoms persisted for several hours and then resolved spontaneously. Patient remained asymptomatic today. Family encouraged him to been seen at [facility] earlier today where he was told to come to ED for concern of STEMI on ECG. Cardiology reviewed ECG and cancelled Code STEMI in ED. Patient remained asymptomatic today. Denies fever, chills, SOB, cough, hemoptysis, unilateral leg pain or swelling, abdominal pain, N/V/D. Out of lisinopril for HTN. No previous cardiac disease.     The history is provided by the patient, a parent and medical records.  Chest Pain Pain location:  Substernal area Pain quality: sharp   Pain radiates to:  L arm Pain severity:  Moderate Onset quality:  Sudden Duration: several hours. Timing:  Constant Progression:  Resolved Chronicity:  New Context: drug use   Relieved by:  Nothing Worsened by:  Nothing Ineffective treatments:  None tried Associated symptoms: no abdominal pain, no back pain, no cough, no diaphoresis, no fever, no nausea, no palpitations, no shortness of breath and no vomiting   Risk factors: no prior DVT/PE   Risk factors comment:  Drug use      Past Medical History:  Diagnosis Date  . Addiction to drug (HCC)   . Anxiety   . Depression   . Hepatitis C   . History of COVID-19 02/2019    Patient Active Problem List   Diagnosis Date Noted  . Chronic hepatitis C with hepatic coma (HCC) 03/23/2020  . Elevated LFTs 12/26/2019  . Cellulitis of right hand 12/25/2019  . History of  hepatitis C 12/25/2019  . Essential hypertension 12/25/2019  . Cellulitis 12/25/2019  . Generalized anxiety disorder 09/18/2016  . SEBACEOUS CYST, SCALP 05/18/2009    Past Surgical History:  Procedure Laterality Date  . I & D EXTREMITY Right 12/27/2019   Procedure: IRRIGATION AND DEBRIDEMENT RIGHT WRIST;  Surgeon: Bradly Bienenstock, MD;  Location: [facility] OR;  Service: Orthopedics;  Laterality: Right;  . KNEE SURGERY         Family History  Problem Relation Age of Onset  . Hypertension Other     Social History   Tobacco Use  . Smoking status: Former Smoker    Types: Cigarettes    Quit date: 02/07/2015    Years since quitting: 5.8  . Smokeless tobacco: Former Neurosurgeon    Types: Snuff    Quit date: 12/14/2016  Vaping Use  . Vaping Use: Never used  Substance Use Topics  . Alcohol use: Yes    Comment: 4 beers a week  . Drug use: No    Home Medications Prior to Admission medications   Medication Sig Start Date End Date Taking? Authorizing Provider  amLODipine (NORVASC) 10 MG tablet Take 1 tablet (10 mg total) by mouth daily for 21 days. 12/07/20 12/28/20  Tonia Brooms, MD  ARIPiprazole (ABILIFY) 10 MG tablet Take 10 tablets by mouth. 08/17/20   [provider]  buPROPion (WELLBUTRIN XL) 150 MG 24 hr tablet Take with 300 for a total of 450 Patient taking differently: Take 150 mg by  mouth daily. Take with Wellbutrin xl 300 to make 450 mg 04/01/18   Burnard Leigh, MD  buPROPion (WELLBUTRIN XL) 300 MG 24 hr tablet Take 1 tablet (300 mg total) by mouth daily. Patient taking differently: Take 300 mg by mouth daily. Take with Wellbutrin xl 150 to make 450 mg 04/01/18   Eksir, Bo Mcclintock, MD  doxycycline (VIBRAMYCIN) 100 MG capsule Take 1 capsule (100 mg total) by mouth 2 (two) times daily. Patient not taking: Reported on 09/04/2020 12/28/19   Leroy Sea, MD  gentamicin (GARAMYCIN) 0.3 % ophthalmic solution Place 1 drop into the left eye every 6 (six) hours.  Patient not  taking: Reported on 09/04/2020 12/23/19   [provider]  hydrochlorothiazide (HYDRODIURIL) 25 MG tablet Take 25 mg by mouth daily.  Patient not taking: Reported on 09/04/2020    [provider]  lisinopril (ZESTRIL) 20 MG tablet Take 1 tablet (20 mg total) by mouth daily for 21 days. 12/07/20 12/28/20  Tonia Brooms, MD  mirtazapine (REMERON) 15 MG tablet Take 15 mg by mouth at bedtime. Patient not taking: Reported on 09/04/2020 11/11/19   [provider]  mupirocin ointment (BACTROBAN) 2 % SMARTSIG:1 Application Topical 2-3 Times Daily Patient not taking: Reported on 09/04/2020 03/21/20   [provider]  propranolol (INDERAL) 20 MG tablet Take 20 mg by mouth 2 (two) times daily. 08/09/20   [provider]  sulfamethoxazole-trimethoprim (BACTRIM DS) 800-160 MG tablet Take 1 tablet by mouth 2 (two) times daily. Patient not taking: Reported on 09/04/2020 03/21/20   [provider]  tadalafil (CIALIS) 10 MG tablet Take 10 mg by mouth as needed. 12/21/19   [provider]  topiramate (TOPAMAX) 100 MG tablet Take 100 mg by mouth daily. 09/01/20   [provider]    Allergies    Codeine, Hydrocodone-acetaminophen, and Penicillin g  Review of Systems   Review of Systems  Constitutional: Negative for chills, diaphoresis and fever.  HENT: Negative for ear pain and sore throat.   Eyes: Negative for pain and visual disturbance.  Respiratory: Negative for cough and shortness of breath.   Cardiovascular: Positive for chest pain. Negative for palpitations.  Gastrointestinal: Negative for abdominal pain, nausea and vomiting.  Genitourinary: Negative for dysuria and hematuria.  Musculoskeletal: Negative for arthralgias and back pain.  Skin: Negative for color change and rash.  Neurological: Negative for seizures and syncope.  All other systems reviewed and are negative.   Physical Exam Updated Vital Signs BP (!) 135/102   Pulse 72    Temp 97.7 F (36.5 C) (Oral)   Resp 16   SpO2 99%   Physical Exam Vitals and nursing note reviewed.  Constitutional:      General: He is awake. He is not in acute distress.    Appearance: Normal appearance. He is well-developed, well-groomed and normal weight. He is not ill-appearing.  HENT:     Head: Normocephalic and atraumatic.     Right Ear: External ear normal.     Left Ear: External ear normal.     Nose: Nose normal.     Mouth/Throat:     Mouth: Mucous membranes are moist.     Pharynx: Oropharynx is clear. No oropharyngeal exudate or posterior oropharyngeal erythema.  Eyes:     General: No scleral icterus.       Right eye: No discharge.        Left eye: No discharge.     Conjunctiva/sclera: Conjunctivae normal.  Neck:  Vascular: No JVD.  Cardiovascular:     Rate and Rhythm: Normal rate and regular rhythm.     Pulses: Normal pulses.          Radial pulses are 2+ on the right side and 2+ on the left side.       Dorsalis pedis pulses are 2+ on the right side and 2+ on the left side.     Heart sounds: Normal heart sounds.  Pulmonary:     Effort: Pulmonary effort is normal. No respiratory distress.     Breath sounds: Normal breath sounds. No wheezing, rhonchi or rales.  Chest:     Chest wall: No tenderness.  Abdominal:     General: Abdomen is flat. There is no distension.     Palpations: Abdomen is soft.     Tenderness: There is no abdominal tenderness. There is no guarding or rebound.  Musculoskeletal:     Right lower leg: No edema.     Left lower leg: No edema.  Skin:    General: Skin is warm and dry.     Capillary Refill: Capillary refill takes less than 2 seconds.     Findings: No rash.  Neurological:     General: No focal deficit present.     Mental Status: He is alert and oriented to person, place, and time.     GCS: GCS eye subscore is 4. GCS verbal subscore is 5. GCS motor subscore is 6.     Sensory: Sensation is intact. No sensory deficit.     Motor:  Motor function is intact. No weakness.     Gait: Gait is intact.  Psychiatric:        Mood and Affect: Mood normal.        Behavior: Behavior normal. Behavior is cooperative.     ED Results / Procedures / Treatments   Labs (all labs ordered are listed, but only abnormal results are displayed) Labs Reviewed  BASIC METABOLIC PANEL - Abnormal; Notable for the following components:      Result Value   CO2 21 (*)    All other components within normal limits  CBC WITH DIFFERENTIAL/PLATELET  TROPONIN I (HIGH SENSITIVITY)  TROPONIN I (HIGH SENSITIVITY)    EKG EKG Interpretation  Date/Time:  Thursday December 07 2020 13:46:18 EST Ventricular Rate:  70 PR Interval:    QRS Duration: 109 QT Interval:  409 QTC Calculation: 442 R Axis:   52 Text Interpretation: Sinus rhythm Consider left atrial enlargement RSR' in V1 or V2, right VCD or RVH ST elev, probable normal early repol pattern No STEMI Confirmed by Alvester Chou [phone]) on 12/07/2020 2:02:22 PM   Radiology DG Chest Portable 1 View  Result Date: 12/07/2020 CLINICAL DATA:  Chest pain EXAM: PORTABLE CHEST 1 VIEW COMPARISON:  01/30/2005 FINDINGS: The heart size and mediastinal contours are within normal limits. Both lungs are clear. The visualized skeletal structures are unremarkable. IMPRESSION: No active disease. Electronically Signed   By: Duanne Guess D.O.   On: 12/07/2020 14:44    Procedures Procedures  Medications Ordered in ED Medications - No data to display  ED Course  I have reviewed the triage vital signs and the nursing notes.  Pertinent labs & imaging results that were available during my care of the patient were reviewed by me and considered in my medical decision making (see chart for details).    MDM Rules/Calculators/A&P  Patient is a 54yoM with history and physical as described above who presents to the ED for chest pain. VS reassuring and HDS. Patient resting comfortably and in  no acute distress. Asymptomatic on exam. Initial workup includes CBC, BMP, troponin, ECG, and CXR.  Delta troponin negative and other labs reassuring. CXR unremarkable. Remained chest pain free during entire ED course. ECG reassuring for pericarditis or cardiac dysrhythmia. ECG and delta troponin reassuring for ACS. Doubt PE at this time given no tachycardia, no pleuritic chest pain, no hypoxia, no unilateral leg pain or swelling, no hemoptysis, and no previous DVT/PE. CXR reassuring for PNA, PXT, or widened mediastinum. Suspect presentation likely 2/2 to crack cocaine use. Strongly recommend drug cessation and cessation resources provided in AVS. Strict return precautions provided and discussed. Recommend close follow up with PCP on outpatient basis. Patient currently out of antihypertensives; will refill prescriptions. Patient verbalized understanding and amenable with discharge plan. Discharged in stable condition.  Final Clinical Impression(s) / ED Diagnoses Final diagnoses:  Precordial pain    Rx / DC Orders ED Discharge Orders         Ordered    amLODipine (NORVASC) 10 MG tablet  Daily        12/07/20 1833    lisinopril (ZESTRIL) 20 MG tablet  Daily        12/07/20 1833           Tonia BroomsKeith, Tyler, MD 12/08/20 0236    Milagros Lollykstra, Richard S, MD 12/09/20 1921

## 2020-12-07 NOTE — ED Notes (Signed)
Pt states he has no chest pain.

## 2020-12-07 NOTE — Discharge Instructions (Signed)
Substance Abuse Treatment Programs ° °Intensive Outpatient Programs °[facility]     °[address]      °[place], [place]                   °[phone]      ° °[facility] °[address] °[place], [place] °[phone] ° °[facility]     °(Inpatient and outpatient)     °[address]           °[phone]   ° °[facility] °[phone] (Suboxone and Methadone) ° °[address]      °[place], [place] [address]      °336-882-2125      ° °[address] °[place], [place] °852-3033 ° °[facility] (Outpatient/Inpatient, Chemical)    °(insurance only) [phone]      °       °[facility] (Groups & Residential) °[place], [place] °[phone] ° °   °[facility]     °[address]     °[place], [place]      °[phone]      ° °[facility] (for caregivers and family) °[address] °[place], [place] °336-299-4655 ° ° ° ° ° °Residential Treatment Programs °[facility]      °[address], [place], [place] [address]  °[phone]      ° °[facility] °[address] [place], [place] [address] °[phone] ° °[facility]        °336-248-8914      ° °[facility] °[phone] ° °[facility]             °[address]                                         °[place], [place]                                                °[phone] or [phone]                              ° °[facility] [place] °[address] °[place], [address] °[phone] ° °[facility]    °[address]      °[place], [place]     °[phone]      ° °[facility] °[address] °[place], [place] °336-285-9073 ° °[facility]   °[address]     °[place], [place] [address]     °[phone]      °Admissions: 8am-3pm M-F ° °[facility] °[address] °,  [place] °336-227-7417 ° °[facility]: Residential Program (90 Days)   °[place], [place]      °336-725-8389 or 800-758-6077    ° °[facility] °[place],  °(Walk in Hours over the weekend or by referral) ° °[facility] °[address], [place],  [address] °([phone] ° °Crisis Mobile: [facility]:  1-877-626-1772 (for crisis response 24 hours a day) °[facility] Hotline:      1-800-256-2452 °Outpatient Psychiatry and Counseling ° °[facility]: Mobile Crisis   Management 24 hours:  1-877-626-1772 ° °[facility] sliding scale fee and walk in schedule: M-F 8am-12pm/1pm-3pm °[address]  °[place], [place] [address] °[phone] ° °[facility] °[address] °[place], [place] [address] °[phone] ° °[facility] (Former[facility] known [facility])- new patient walk-in appointments available Monday - Friday 8am -3pm.          °[address] °[place], [place] [address] °336-676-6840 or crisis line- 336-676-6905 ° °[facility] Intensive Outpatient Therapy Program °[address] °[place], [place] [address] °336-832-9804 ° °[facility]                  °Crisis Services      °[phone]      °[address]     °[place], [place] [address]                ° °[facility]   °[facility] °[phone] °[address] °[place], [place] [address] ° ° °[facility]          °[address]  °[place], [place] [address]       °[phone] ° °[facility] °[address] °[place], [place] [address] °336-292-1510 ° °[facility]    °[address]    °[place], [place] [address]     °[phone]      ° °Parish McKinney, MD     °[address]     °[place] [address]     °336-282-1251     °  °[facility] °[address] °[place] [address] ° °[facility]     °[address]     °[place], [place]      °336-542-2076      ° °[facility] °Shamsher Ahluwalia, MD °[address] °[place], [place] [address] °[phone] ° °[facility]     °[address]     °[place], [place] [address]     °336-274-1237      ° °[facility] °[address] °[place], [place] [address] °336-854-4450 °Resources for Temporary Residential Assistance/Crisis Centers ° °DAY CENTERS °[facility] °M-F 8am-3pm   °[address] [place], [place] [address]   336-332-0824 °Services include: laundry, barbering, support groups, case management, phone  & computer access, showers, AA/NA mtgs, mental health/substance abuse nurse, job skills class, disability information, VA assistance, spiritual classes, etc.  ° °HOMELESS SHELTERS ° °[facility]     °[facility]   °[address], [place]     °[phone]       °       °[facility] (women and children)       °[address] °[place], [place] [address] °336-275-0820 °[email or web address] for application and process °Application Required ° °[facility]   °[address]    °[place]  [address]     °[phone]       °             °[facility] °[address] °,  [address] °336.273.5572 °336-235-0363(schedule application appt.) °Application Required ° °[facility] (women only)    °[address]     °[place],  [address]     °336-884-1039      °  Intake starts 6pm daily °Need valid ID, SSC, & Police report °[facility] [place] °[address] °[place] °336-881-5420 °Application Required ° °[facility] (men only)     °[address]      °[place], [place]     °[phone]      ° °[facility] °(Pregnant women only) °[address] °[place], [place] °336-275-0206 ° °[facility]      °[address]      °[place], [place] [address]     °[phone]      °       °[facility] °[address] °[place], [place] °336-723-1848 °90 day commitment/SA/Application process ° °[facility](men only)     °[address]     °[place], [place]     °[phone]       °Check-in at 7pm     °       °[facility] °[address] °[place],  [address] °336-248-6684 °Men/Women/Women and Children must be there by 7 pm ° °[facility] °[place],  °336-722-8721                ° °

## 2021-04-23 IMAGING — DX DG CHEST 1V PORT
1 series · 1 of 1 positions shown · non-contrast
Comparison: 01/30/2005

CLINICAL DATA: Chest pain

EXAM:
PORTABLE CHEST 1 VIEW

[chest]
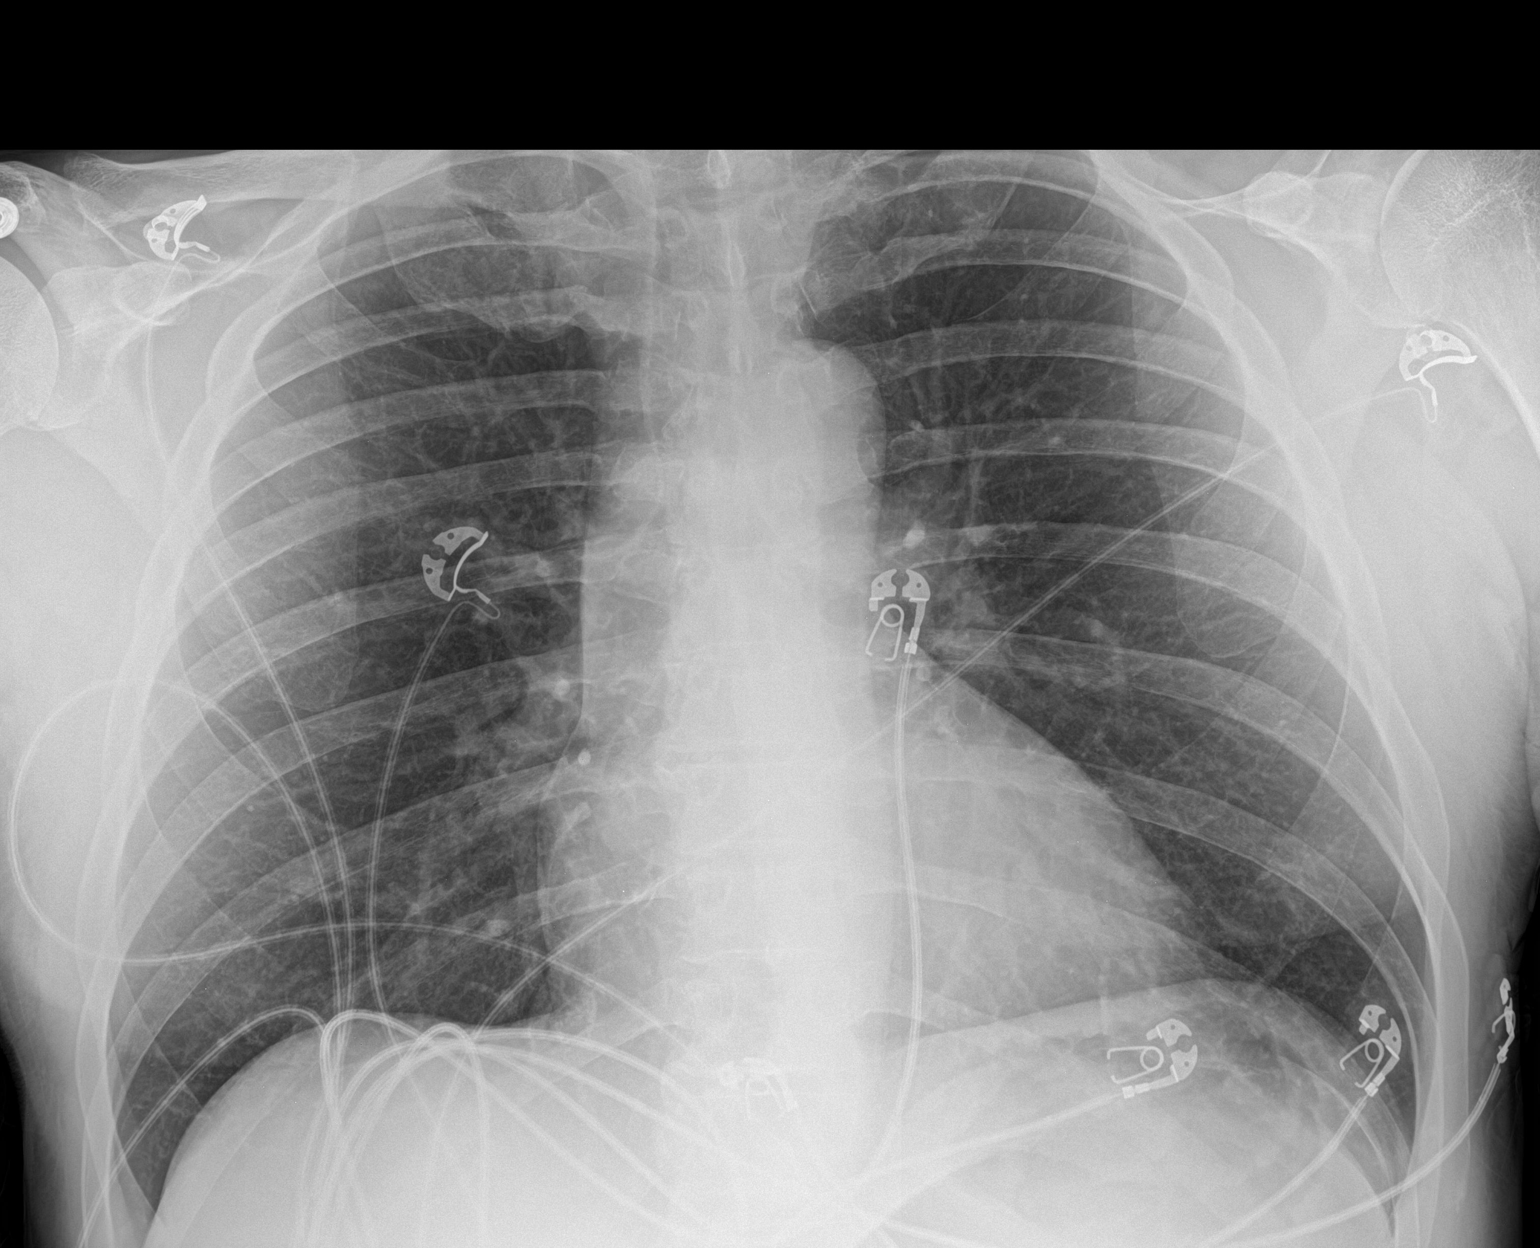

[1 of 1 positions shown; findings below may reference images not displayed]

FINDINGS: The heart size and mediastinal contours are within normal limits.
Both lungs are clear. The visualized skeletal structures are
unremarkable.
IMPRESSION: No active disease.
